# Patient Record
Sex: Male | Born: 1950
Health system: Southern US, Community
[De-identification: ages and names within clinical notes are randomized; demographics above are authoritative.]

## PROBLEM LIST (undated history)

## (undated) DIAGNOSIS — M1990 Unspecified osteoarthritis, unspecified site: Secondary | ICD-10-CM

## (undated) DIAGNOSIS — N200 Calculus of kidney: Secondary | ICD-10-CM

## (undated) DIAGNOSIS — E785 Hyperlipidemia, unspecified: Secondary | ICD-10-CM

## (undated) DIAGNOSIS — E119 Type 2 diabetes mellitus without complications: Secondary | ICD-10-CM

## (undated) DIAGNOSIS — K219 Gastro-esophageal reflux disease without esophagitis: Secondary | ICD-10-CM

## (undated) DIAGNOSIS — I519 Heart disease, unspecified: Secondary | ICD-10-CM

## (undated) DIAGNOSIS — K5090 Crohn's disease, unspecified, without complications: Secondary | ICD-10-CM

## (undated) DIAGNOSIS — D682 Hereditary deficiency of other clotting factors: Secondary | ICD-10-CM

## (undated) DIAGNOSIS — K8020 Calculus of gallbladder without cholecystitis without obstruction: Secondary | ICD-10-CM

## (undated) DIAGNOSIS — K859 Acute pancreatitis without necrosis or infection, unspecified: Secondary | ICD-10-CM

## (undated) DIAGNOSIS — F329 Major depressive disorder, single episode, unspecified: Secondary | ICD-10-CM

## (undated) DIAGNOSIS — F419 Anxiety disorder, unspecified: Secondary | ICD-10-CM

## (undated) DIAGNOSIS — I82409 Acute embolism and thrombosis of unspecified deep veins of unspecified lower extremity: Secondary | ICD-10-CM

## (undated) DIAGNOSIS — F32A Depression, unspecified: Secondary | ICD-10-CM

## (undated) HISTORY — PX: PACEMAKER IMPLANT: EP1218

## (undated) HISTORY — DX: Gastro-esophageal reflux disease without esophagitis: K21.9

## (undated) HISTORY — DX: Type 2 diabetes mellitus without complications: E11.9

## (undated) HISTORY — DX: Calculus of gallbladder without cholecystitis without obstruction: K80.20

## (undated) HISTORY — DX: Acute pancreatitis without necrosis or infection, unspecified: K85.90

## (undated) HISTORY — DX: Depression, unspecified: F32.A

## (undated) HISTORY — DX: Unspecified osteoarthritis, unspecified site: M19.90

## (undated) HISTORY — PX: OTHER SURGICAL HISTORY: SHX169

## (undated) HISTORY — PX: DENTAL SURGERY: SHX609

## (undated) HISTORY — DX: Acute embolism and thrombosis of unspecified deep veins of unspecified lower extremity: I82.409

## (undated) HISTORY — DX: Crohn's disease, unspecified, without complications: K50.90

## (undated) HISTORY — PX: COLONOSCOPY: SHX174

## (undated) HISTORY — DX: Heart disease, unspecified: I51.9

## (undated) HISTORY — PX: UPPER GASTROINTESTINAL ENDOSCOPY: SHX188

## (undated) HISTORY — PX: KIDNEY STONE SURGERY: SHX686

## (undated) HISTORY — DX: Hereditary deficiency of other clotting factors: D68.2

## (undated) HISTORY — PX: PACEMAKER REVISION: EP1221

## (undated) HISTORY — PX: CHOLECYSTECTOMY: SHX55

## (undated) HISTORY — DX: Hyperlipidemia, unspecified: E78.5

## (undated) HISTORY — DX: Anxiety disorder, unspecified: F41.9

## (undated) HISTORY — DX: Calculus of kidney: N20.0

---
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
## 1999-09-25 ENCOUNTER — Ambulatory Visit (HOSPITAL_COMMUNITY): Admission: RE | Admit: 1999-09-25 | Discharge: 1999-09-26 | Payer: Self-pay | Admitting: Internal Medicine

## 1999-09-26 ENCOUNTER — Encounter: Payer: Self-pay | Admitting: Internal Medicine

## 2002-01-23 ENCOUNTER — Encounter: Payer: Self-pay | Admitting: Internal Medicine

## 2002-06-21 ENCOUNTER — Encounter: Payer: Self-pay | Admitting: General Surgery

## 2002-06-27 ENCOUNTER — Encounter (INDEPENDENT_AMBULATORY_CARE_PROVIDER_SITE_OTHER): Payer: Self-pay | Admitting: *Deleted

## 2002-06-27 ENCOUNTER — Ambulatory Visit (HOSPITAL_COMMUNITY): Admission: RE | Admit: 2002-06-27 | Discharge: 2002-06-27 | Payer: Self-pay | Admitting: General Surgery

## 2004-08-09 ENCOUNTER — Ambulatory Visit: Payer: Self-pay | Admitting: Internal Medicine

## 2004-08-12 ENCOUNTER — Ambulatory Visit: Payer: Self-pay

## 2004-09-11 ENCOUNTER — Ambulatory Visit: Payer: Self-pay | Admitting: Internal Medicine

## 2004-09-12 ENCOUNTER — Ambulatory Visit: Payer: Self-pay | Admitting: Internal Medicine

## 2004-10-14 ENCOUNTER — Ambulatory Visit: Payer: Self-pay | Admitting: Internal Medicine

## 2004-11-20 ENCOUNTER — Ambulatory Visit: Payer: Self-pay | Admitting: Internal Medicine

## 2004-12-22 ENCOUNTER — Ambulatory Visit: Payer: Self-pay | Admitting: Internal Medicine

## 2005-02-09 ENCOUNTER — Ambulatory Visit: Payer: Self-pay | Admitting: Internal Medicine

## 2005-03-11 ENCOUNTER — Ambulatory Visit: Payer: Self-pay | Admitting: Internal Medicine

## 2005-05-12 ENCOUNTER — Ambulatory Visit: Payer: Self-pay | Admitting: Internal Medicine

## 2005-05-26 ENCOUNTER — Ambulatory Visit: Payer: Self-pay | Admitting: Internal Medicine

## 2005-06-02 ENCOUNTER — Ambulatory Visit (HOSPITAL_COMMUNITY): Admission: RE | Admit: 2005-06-02 | Discharge: 2005-06-02 | Payer: Self-pay | Admitting: Internal Medicine

## 2005-06-02 ENCOUNTER — Ambulatory Visit: Payer: Self-pay | Admitting: Internal Medicine

## 2005-06-23 ENCOUNTER — Ambulatory Visit: Payer: Self-pay

## 2005-06-29 ENCOUNTER — Ambulatory Visit: Payer: Self-pay | Admitting: Internal Medicine

## 2005-09-29 ENCOUNTER — Ambulatory Visit: Payer: Self-pay | Admitting: Internal Medicine

## 2006-08-31 ENCOUNTER — Ambulatory Visit: Payer: Self-pay | Admitting: Internal Medicine

## 2006-10-16 ENCOUNTER — Ambulatory Visit: Payer: Self-pay | Admitting: Internal Medicine

## 2007-01-31 ENCOUNTER — Ambulatory Visit: Payer: Self-pay | Admitting: Internal Medicine

## 2007-03-28 ENCOUNTER — Ambulatory Visit: Payer: Self-pay | Admitting: Internal Medicine

## 2007-07-27 ENCOUNTER — Ambulatory Visit: Payer: Self-pay | Admitting: Internal Medicine

## 2007-09-06 ENCOUNTER — Ambulatory Visit: Payer: Self-pay | Admitting: Internal Medicine

## 2007-10-04 ENCOUNTER — Ambulatory Visit: Payer: Self-pay | Admitting: Internal Medicine

## 2007-11-15 ENCOUNTER — Ambulatory Visit: Payer: Self-pay | Admitting: Internal Medicine

## 2008-01-16 ENCOUNTER — Ambulatory Visit: Payer: Self-pay | Admitting: Internal Medicine

## 2008-03-15 ENCOUNTER — Ambulatory Visit: Payer: Self-pay

## 2008-03-27 ENCOUNTER — Telehealth: Payer: Self-pay | Admitting: Internal Medicine

## 2008-03-28 ENCOUNTER — Ambulatory Visit: Payer: Self-pay | Admitting: Internal Medicine

## 2008-03-28 DIAGNOSIS — R519 Headache, unspecified: Secondary | ICD-10-CM | POA: Insufficient documentation

## 2008-03-28 DIAGNOSIS — C439 Malignant melanoma of skin, unspecified: Secondary | ICD-10-CM | POA: Insufficient documentation

## 2008-03-28 DIAGNOSIS — R197 Diarrhea, unspecified: Secondary | ICD-10-CM | POA: Insufficient documentation

## 2008-03-28 DIAGNOSIS — R112 Nausea with vomiting, unspecified: Secondary | ICD-10-CM | POA: Insufficient documentation

## 2008-03-28 DIAGNOSIS — R634 Abnormal weight loss: Secondary | ICD-10-CM | POA: Insufficient documentation

## 2008-03-28 DIAGNOSIS — Z950 Presence of cardiac pacemaker: Secondary | ICD-10-CM | POA: Insufficient documentation

## 2008-03-28 DIAGNOSIS — R1084 Generalized abdominal pain: Secondary | ICD-10-CM | POA: Insufficient documentation

## 2008-03-28 DIAGNOSIS — Z85820 Personal history of malignant melanoma of skin: Secondary | ICD-10-CM | POA: Insufficient documentation

## 2008-03-28 DIAGNOSIS — Z872 Personal history of diseases of the skin and subcutaneous tissue: Secondary | ICD-10-CM | POA: Insufficient documentation

## 2008-03-28 DIAGNOSIS — R1032 Left lower quadrant pain: Secondary | ICD-10-CM | POA: Insufficient documentation

## 2008-03-28 DIAGNOSIS — E785 Hyperlipidemia, unspecified: Secondary | ICD-10-CM | POA: Insufficient documentation

## 2008-03-28 DIAGNOSIS — E119 Type 2 diabetes mellitus without complications: Secondary | ICD-10-CM

## 2008-03-28 DIAGNOSIS — R51 Headache: Secondary | ICD-10-CM | POA: Insufficient documentation

## 2008-03-30 ENCOUNTER — Ambulatory Visit: Payer: Self-pay | Admitting: Cardiology

## 2008-04-02 ENCOUNTER — Telehealth: Payer: Self-pay | Admitting: Internal Medicine

## 2008-04-02 DIAGNOSIS — K8020 Calculus of gallbladder without cholecystitis without obstruction: Secondary | ICD-10-CM | POA: Insufficient documentation

## 2008-04-03 ENCOUNTER — Ambulatory Visit: Payer: Self-pay | Admitting: Internal Medicine

## 2008-04-05 ENCOUNTER — Ambulatory Visit (HOSPITAL_COMMUNITY): Admission: RE | Admit: 2008-04-05 | Discharge: 2008-04-05 | Payer: Self-pay | Admitting: Internal Medicine

## 2008-04-09 ENCOUNTER — Ambulatory Visit: Payer: Self-pay | Admitting: Internal Medicine

## 2008-04-09 ENCOUNTER — Encounter: Payer: Self-pay | Admitting: Internal Medicine

## 2008-04-11 ENCOUNTER — Encounter: Payer: Self-pay | Admitting: Internal Medicine

## 2008-04-12 LAB — CONVERTED CEMR LAB
ALT: 24 units/L (ref 0–53)
Basophils Absolute: 0 10*3/uL (ref 0.0–0.1)
Basophils Relative: 0.7 % (ref 0.0–3.0)
CO2: 25 meq/L (ref 19–32)
Calcium: 9 mg/dL (ref 8.4–10.5)
Chloride: 105 meq/L (ref 96–112)
GFR calc Af Amer: 112 mL/min
GFR calc non Af Amer: 92 mL/min
Glucose, Bld: 109 mg/dL — ABNORMAL HIGH (ref 70–99)
Hemoglobin: 14.3 g/dL (ref 13.0–17.0)
Lymphocytes Relative: 27.6 % (ref 12.0–46.0)
Monocytes Relative: 8.8 % (ref 3.0–12.0)
Neutro Abs: 3 10*3/uL (ref 1.4–7.7)
Neutrophils Relative %: 61.1 % (ref 43.0–77.0)
RBC: 4.65 M/uL (ref 4.22–5.81)
RDW: 13.6 % (ref 11.5–14.6)
Sodium: 139 meq/L (ref 135–145)
Total Bilirubin: 1.4 mg/dL — ABNORMAL HIGH (ref 0.3–1.2)
Total Protein: 6 g/dL (ref 6.0–8.3)

## 2008-06-27 ENCOUNTER — Ambulatory Visit: Payer: Self-pay | Admitting: Internal Medicine

## 2008-08-22 ENCOUNTER — Ambulatory Visit: Payer: Self-pay | Admitting: Internal Medicine

## 2008-10-18 ENCOUNTER — Encounter: Payer: Self-pay | Admitting: Internal Medicine

## 2008-12-12 ENCOUNTER — Ambulatory Visit: Payer: Self-pay | Admitting: Internal Medicine

## 2009-04-12 ENCOUNTER — Telehealth: Payer: Self-pay | Admitting: Internal Medicine

## 2009-04-16 ENCOUNTER — Encounter: Payer: Self-pay | Admitting: Physician Assistant

## 2009-04-16 ENCOUNTER — Ambulatory Visit: Payer: Self-pay | Admitting: Gastroenterology

## 2009-04-16 DIAGNOSIS — K5730 Diverticulosis of large intestine without perforation or abscess without bleeding: Secondary | ICD-10-CM | POA: Insufficient documentation

## 2009-04-16 DIAGNOSIS — R1012 Left upper quadrant pain: Secondary | ICD-10-CM | POA: Insufficient documentation

## 2009-04-16 DIAGNOSIS — R1033 Periumbilical pain: Secondary | ICD-10-CM | POA: Insufficient documentation

## 2009-04-17 ENCOUNTER — Encounter: Payer: Self-pay | Admitting: Physician Assistant

## 2009-04-17 ENCOUNTER — Ambulatory Visit: Payer: Self-pay | Admitting: Internal Medicine

## 2009-04-17 LAB — CONVERTED CEMR LAB
Alkaline Phosphatase: 97 units/L (ref 39–117)
BUN: 11 mg/dL (ref 6–23)
Basophils Relative: 0.2 % (ref 0.0–3.0)
CO2: 33 meq/L — ABNORMAL HIGH (ref 19–32)
CRP, High Sensitivity: <1 (ref 0.00–5.00)
Creatinine, Ser: 0.8 mg/dL (ref 0.4–1.5)
Eosinophils Relative: 2.2 % (ref 0.0–5.0)
GFR calc non Af Amer: 105.17 mL/min (ref >60)
Glucose, Bld: 131 mg/dL — ABNORMAL HIGH (ref 70–99)
HCT: 43.1 % (ref 39.0–52.0)
Hemoglobin: 14.9 g/dL (ref 13.0–17.0)
Ketones, ur: NEGATIVE mg/dL
Leukocytes, UA: NEGATIVE
Lipase: 23 units/L (ref 11.0–59.0)
Lymphs Abs: 1.6 10*3/uL (ref 0.7–4.0)
MCV: 88.8 fL (ref 78.0–100.0)
Monocytes Absolute: 0.6 10*3/uL (ref 0.1–1.0)
Monocytes Relative: 9.3 % (ref 3.0–12.0)
Neutro Abs: 3.8 10*3/uL (ref 1.4–7.7)
Nitrite: NEGATIVE
Platelets: 162 10*3/uL (ref 150.0–400.0)
RBC: 4.85 M/uL (ref 4.22–5.81)
Sodium: 139 meq/L (ref 135–145)
Specific Gravity, Urine: >=1.03 (ref 1.000–1.030)
Total Bilirubin: 1.1 mg/dL (ref 0.3–1.2)
Total Protein, Urine: NEGATIVE mg/dL
WBC: 6.1 10*3/uL (ref 4.5–10.5)
pH: 5 (ref 5.0–8.0)

## 2009-04-20 ENCOUNTER — Ambulatory Visit: Payer: Self-pay | Admitting: Internal Medicine

## 2009-04-25 ENCOUNTER — Encounter: Payer: Self-pay | Admitting: Physician Assistant

## 2009-05-03 ENCOUNTER — Encounter: Payer: Self-pay | Admitting: Gastroenterology

## 2009-05-03 ENCOUNTER — Ambulatory Visit: Payer: Self-pay | Admitting: Internal Medicine

## 2009-05-03 DIAGNOSIS — R935 Abnormal findings on diagnostic imaging of other abdominal regions, including retroperitoneum: Secondary | ICD-10-CM | POA: Insufficient documentation

## 2009-05-06 ENCOUNTER — Encounter: Payer: Self-pay | Admitting: Internal Medicine

## 2009-05-06 LAB — CONVERTED CEMR LAB
AST: 14 units/L (ref 0–37)
Alkaline Phosphatase: 98 units/L (ref 39–117)
Amylase: 89 units/L (ref 27–131)
BUN: 10 mg/dL (ref 6–23)
Glucose, Bld: 241 mg/dL — ABNORMAL HIGH (ref 70–99)
Lipase: 67 units/L — ABNORMAL HIGH (ref 11.0–59.0)
Sodium: 140 meq/L (ref 135–145)
Total Bilirubin: 1 mg/dL (ref 0.3–1.2)

## 2009-05-23 ENCOUNTER — Ambulatory Visit (HOSPITAL_COMMUNITY): Admission: RE | Admit: 2009-05-23 | Discharge: 2009-05-23 | Payer: Self-pay | Admitting: Gastroenterology

## 2009-05-23 ENCOUNTER — Ambulatory Visit: Payer: Self-pay | Admitting: Gastroenterology

## 2009-05-29 ENCOUNTER — Encounter: Payer: Self-pay | Admitting: Internal Medicine

## 2009-07-04 ENCOUNTER — Telehealth (INDEPENDENT_AMBULATORY_CARE_PROVIDER_SITE_OTHER): Payer: Self-pay | Admitting: *Deleted

## 2009-07-10 ENCOUNTER — Encounter (INDEPENDENT_AMBULATORY_CARE_PROVIDER_SITE_OTHER): Payer: Self-pay | Admitting: General Surgery

## 2009-07-10 ENCOUNTER — Ambulatory Visit (HOSPITAL_COMMUNITY): Admission: RE | Admit: 2009-07-10 | Discharge: 2009-07-11 | Payer: Self-pay | Admitting: General Surgery

## 2009-07-25 ENCOUNTER — Encounter: Payer: Self-pay | Admitting: Internal Medicine

## 2009-08-14 ENCOUNTER — Encounter: Payer: Self-pay | Admitting: Internal Medicine

## 2009-08-28 ENCOUNTER — Encounter: Payer: Self-pay | Admitting: Internal Medicine

## 2009-08-29 ENCOUNTER — Ambulatory Visit: Payer: Self-pay | Admitting: Internal Medicine

## 2009-10-24 ENCOUNTER — Encounter: Admission: RE | Admit: 2009-10-24 | Discharge: 2009-10-24 | Payer: Self-pay | Admitting: Unknown Physician Specialty

## 2009-11-28 ENCOUNTER — Ambulatory Visit: Payer: Self-pay | Admitting: Internal Medicine

## 2010-02-03 ENCOUNTER — Telehealth: Payer: Self-pay | Admitting: Internal Medicine

## 2010-02-03 ENCOUNTER — Ambulatory Visit: Payer: Self-pay | Admitting: Internal Medicine

## 2010-02-06 ENCOUNTER — Encounter (INDEPENDENT_AMBULATORY_CARE_PROVIDER_SITE_OTHER): Payer: Self-pay | Admitting: *Deleted

## 2010-02-10 ENCOUNTER — Ambulatory Visit: Payer: Self-pay | Admitting: Internal Medicine

## 2010-02-10 DIAGNOSIS — R42 Dizziness and giddiness: Secondary | ICD-10-CM | POA: Insufficient documentation

## 2010-02-24 ENCOUNTER — Ambulatory Visit: Payer: Self-pay | Admitting: Internal Medicine

## 2010-02-24 DIAGNOSIS — K861 Other chronic pancreatitis: Secondary | ICD-10-CM | POA: Insufficient documentation

## 2010-02-25 LAB — CONVERTED CEMR LAB
CEA: 0.9 ng/mL (ref 0.0–5.0)
Lipase: 82 units/L — ABNORMAL HIGH (ref 11.0–59.0)

## 2010-03-12 ENCOUNTER — Ambulatory Visit: Payer: Self-pay | Admitting: Endocrinology

## 2010-04-02 ENCOUNTER — Ambulatory Visit: Payer: Self-pay | Admitting: Endocrinology

## 2010-04-23 ENCOUNTER — Ambulatory Visit: Payer: Self-pay | Admitting: Endocrinology

## 2010-05-29 ENCOUNTER — Ambulatory Visit: Payer: Self-pay | Admitting: Internal Medicine

## 2010-06-11 ENCOUNTER — Ambulatory Visit: Payer: Self-pay | Admitting: Internal Medicine

## 2010-07-24 ENCOUNTER — Encounter (INDEPENDENT_AMBULATORY_CARE_PROVIDER_SITE_OTHER): Payer: Self-pay | Admitting: *Deleted

## 2010-08-12 NOTE — Procedures (Signed)
Summary: [facility]/ Lipase, CMET, Amylase  [facility]/ Lipase, CMET, Amylase   Imported By: Christie Nottingham CMA (AAMA) 02/21/2010 16:24:06  _____________________________________________________________________  External Attachment:    Type:   Image     Comment:   External Document

## 2010-08-12 NOTE — Assessment & Plan Note (Signed)
Summary: 3 WK FU---STC   Vital Signs:  Patient profile:   60 year old male Height:      68 inches (172.72 cm) Weight:      191.44 pounds (87.02 kg) BMI:     29.21 O2 Sat:      95 % on Room air Temp:     97.1 degrees F (36.17 degrees C) oral Pulse rate:   70 / minute BP sitting:   108 / 72  (left arm) Cuff size:   regular  Vitals Entered By: Brenton Grills MA (April 23, 2010 10:44 AM)  O2 Flow:  Room air CC: Follow-up visit/aj   Referring Provider:  Feliciana Rossetti, MD Primary Provider:  Feliciana Rossetti, MD  CC:  Follow-up visit/aj.  History of Present Illness: he brings a record of his cbg's which i have reviewed today. cbg's are highest at hs, and in am (200's), and lower in the afternoon (100).  he continues to have intermittent abdominal pain.  Current Medications (verified): 1)  Simvastatin 40 Mg Tabs (Simvastatin) .... Take 1 Tab At Bedtime 2)  Neurontin 600 Mg Tabs (Gabapentin) .... One By Mouth Two Times A Day 3)  Actos 45 Mg Tabs (Pioglitazone Hcl) .... Take 1 Tab Once Daily 4)  Ambien Cr 12.5 Mg Cr-Tabs (Zolpidem Tartrate) .... Take As Needed 5)  Tamsulosin Hcl 0.4 Mg Caps (Tamsulosin Hcl) .Marland Kitchen.. 1 Capsule By Mouth Once Daily 6)  Paxil 20 Mg Tabs (Paroxetine Hcl) .Marland Kitchen.. 1 Tablet By Mouth Once Daily 7)  Skelaxin 800 Mg Tabs (Metaxalone) .... 1/2 Tablet By Mouth Two Times A Day 8)  Ranitidine Hcl 150 Mg Caps (Ranitidine Hcl) .... One By Mouth Daily 9)  Lantus 100 Unit/ml Soln (Insulin Glargine) .... Take 20 Units Every Night 10)  Creon 12000 Unit Cpep (Pancrelipase (Lip-Prot-Amyl)) .Marland Kitchen.. 1 By Mouth Before Each Meal 11)  Humalog Kwikpen 100 Unit/ml Soln (Insulin Lispro (Human)) .... 8 Units Three Times A Day (Just Before Each Meal), and Pen Needles Three Times A Day  Allergies (verified): No Known Drug Allergies  Past History:  Past Medical History: Last updated: 02/24/2010 Current Problems:  DIABETES MELLITUS, TYPE II, ON INSULIN (ICD-250.00) DIZZINESS  (ICD-780.4) NONSPEC ABN FINDNG RAD & OTH EXAM ABDOMINAL AREA (ICD-793.6) DIVERTICULOSIS-COLON (ICD-562.10) ABDOMINAL PAIN-PERIUMBILICAL (ICD-789.05) LUQ PAIN (ICD-789.02) CHOLELITHIASIS (ICD-574.20) Hx of MELANOMA (ICD-172.9) Hx of PERSONAL HISTORY OF MALIGNANT MELANOMA OF SKIN (ICD-V10.82) NAUSEA AND VOMITING (ICD-787.01) WEIGHT LOSS-ABNORMAL (ICD-783.21) DIARRHEA (ICD-787.91) ABDOMINAL PAIN-LLQ (ICD-789.04) ABDOMINAL PAIN-GENERALIZED (ICD-789.07) HEADACHE, CHRONIC (ICD-784.0) HYPERLIPIDEMIA (ICD-272.4) DM (ICD-250.00) ANAL FISSURE, HX OF (ICD-V13.3) CARDIAC PACEMAKER IN SITU (ICD-V45.01)  Review of Systems  The patient denies hypoglycemia.    Physical Exam  General:  normal appearance.   Psych:  Alert and cooperative; normal mood and affect; normal attention span and concentration.     Impression & Recommendations:  Problem # 1:  DIABETES MELLITUS, TYPE II, ON INSULIN (ICD-250.00) he needs some adjustment in his therapy  Medications Added to Medication List This Visit: 1)  Humalog Kwikpen 100 Unit/ml Soln (Insulin lispro (human)) .... Three times a day (just before each meal), and pen needles three times a day 02-17-17 units. 2)  Onetouch Ultra Blue Strp (Glucose blood) .... Three times a day, and lancets 250.01  Other Orders: Est. Patient Level III (16109)  Patient Instructions: 1)  check your blood sugar 2 times a day.  vary the time of day when you check, between before the 3 meals, and at bedtime.  also check if you have symptoms  of your blood sugar being too high or too low.  please keep a record of the readings and bring it to your next appointment here.  please call us sooner if you are having low blood sugar episodes. 2)  we will need to take this complex situation in stages. 3)  continue lantus 20 units at bedtime. 4)  increase humalog to units three times a day (just before each meal). 02-17-17. 5)  Please schedule a follow-up appointment in 2-3 weeks. 6)  stop  actos when yuou run out of your current supply.   Prescriptions: ONETOUCH ULTRA BLUE  STRP (GLUCOSE BLOOD) three times a day, and lancets 250.01  #270 x 3   Entered and Authorized by:   Minus Breeding MD   Signed by:   Minus Breeding MD on 04/23/2010   Method used:   Print then Give to Patient   RxID:   [id number]

## 2010-08-12 NOTE — Cardiovascular Report (Signed)
Summary: TTM   TTM   Imported By: Roderic Ovens 09/09/2009 14:03:53  _____________________________________________________________________  External Attachment:    Type:   Image     Comment:   External Document

## 2010-08-12 NOTE — Assessment & Plan Note (Signed)
Summary: NEW ENDO CON/ [employer]/ DM /NWS  #   Vital Signs:  Patient profile:   60 year old male Height:      68 inches (172.72 cm) Weight:      193.50 pounds (87.95 kg) BMI:     29.53 O2 Sat:      96 % on Room air Temp:     96.7 degrees F (35.94 degrees C) oral Pulse rate:   70 / minute BP sitting:   110 / 68  (left arm) Cuff size:   regular  Vitals Entered By: Brenton Grills MA (March 12, 2010 9:55 AM)  O2 Flow:  Room air CC: New Endo/DM/chronic pancreatitis/pt requesting samples/aj Is Patient Diabetic? Yes   Referring Provider:  Feliciana Rossetti, MD Primary Provider:  Feliciana Rossetti, MD  CC:  New Endo/DM/chronic pancreatitis/pt requesting samples/aj.  History of Present Illness: pt states 10 years h/o dm.  it is complicated by mild cad, and neuropathy.  he has been on insulin x 1 month.  he takes lantus 30 units qhs.  no cbg record, but states cbg's vary from 160-400.  it is in general higher later in the day.     pt says his diet and exercise are "good."   symptomatically, pt states few years of moderate pain at the hands and feet, and associated numbness. use of metformin was limited by diarrhea.  Current Medications (verified): 1)  Simvastatin 40 Mg Tabs (Simvastatin) .... Take 1 Tab At Bedtime 2)  Neurontin 600 Mg Tabs (Gabapentin) .... One By Mouth Two Times A Day 3)  Actos 45 Mg Tabs (Pioglitazone Hcl) .... Take 1 Tab Once Daily 4)  Ambien Cr 12.5 Mg Cr-Tabs (Zolpidem Tartrate) .... Take As Needed 5)  Tamsulosin Hcl 0.4 Mg Caps (Tamsulosin Hcl) .Marland Kitchen.. 1 Capsule By Mouth Once Daily 6)  Paxil 20 Mg Tabs (Paroxetine Hcl) .Marland Kitchen.. 1 Tablet By Mouth Once Daily 7)  Skelaxin 800 Mg Tabs (Metaxalone) .... 1/2 Tablet By Mouth Two Times A Day 8)  Ranitidine Hcl 150 Mg Caps (Ranitidine Hcl) .... One By Mouth Daily 9)  Lantus 100 Unit/ml Soln (Insulin Glargine) .... Take 30 Units By Mouth Every Night 10)  Creon 12000 Unit Cpep (Pancrelipase (Lip-Prot-Amyl)) .Marland Kitchen.. 1 By Mouth Before Each  Meal  Allergies (verified): No Known Drug Allergies  Past History:  Past Medical History: Last updated: 02/24/2010 Current Problems:  DIABETES MELLITUS, TYPE II, ON INSULIN (ICD-250.00) DIZZINESS (ICD-780.4) NONSPEC ABN FINDNG RAD & OTH EXAM ABDOMINAL AREA (ICD-793.6) DIVERTICULOSIS-COLON (ICD-562.10) ABDOMINAL PAIN-PERIUMBILICAL (ICD-789.05) LUQ PAIN (ICD-789.02) CHOLELITHIASIS (ICD-574.20) Hx of MELANOMA (ICD-172.9) Hx of PERSONAL HISTORY OF MALIGNANT MELANOMA OF SKIN (ICD-V10.82) NAUSEA AND VOMITING (ICD-787.01) WEIGHT LOSS-ABNORMAL (ICD-783.21) DIARRHEA (ICD-787.91) ABDOMINAL PAIN-LLQ (ICD-789.04) ABDOMINAL PAIN-GENERALIZED (ICD-789.07) HEADACHE, CHRONIC (ICD-784.0) HYPERLIPIDEMIA (ICD-272.4) DM (ICD-250.00) ANAL FISSURE, HX OF (ICD-V13.3) CARDIAC PACEMAKER IN SITU (ICD-V45.01)  Social History: Reviewed history from 04/16/2009 and no changes required. Occupation: Retired married Patient has never smoked.  Daily Caffeine Use Coffee and sodas Patient does not get regular exercise.  Alcohol Use - no no children   Review of Systems       The patient complains of weight loss, vision loss, headaches, and depression.         denies chest pain, n/v, urinary excessive diaphoresis, memory loss, hypoglycemia, and rhinorrhea.  he has chronic abdominal pain, polyuria, doe, easy bruising, and leg cramps.  Physical Exam  General:  obese.  no distress  Head:  head: no deformity eyes: no periorbital swelling, no proptosis external  nose and ears are normal mouth: no lesion seen Neck:  Supple without thyroid enlargement or tenderness. .  Lungs:  Clear to auscultation bilaterally. Normal respiratory effort.  Heart:  Regular rate and rhythm without murmurs or gallops noted. Normal S1,S2.   Abdomen:  abdomen is soft, nontender.  no hepatosplenomegaly.   not distended.  no hernia  Msk:  muscle bulk and strength are grossly normal.  no obvious joint swelling.  gait is normal  and steady  Pulses:  dorsalis pedis intact bilat.  no carotid bruit  Extremities:  no deformity.  no ulcer on the feet.  feet are of normal color and temp.  no edema several toenails are surgically absent Neurologic:  cn 2-12 grossly intact.   readily moves all 4's.   sensation is intact to touch on the feet  Skin:  normal texture and temp.  no rash.  not diaphoretic  Cervical Nodes:  No significant adenopathy.  Psych:  Alert and cooperative; normal mood and affect; normal attention span and concentration.     Impression & Recommendations:  Problem # 1:  DIABETES MELLITUS, TYPE II, ON INSULIN (ICD-250.00) probably type 2 dm needs increased rx  Problem # 2:  PANCREATITIS, CHRONIC (ICD-577.1) not the cause of #1, as the onset was later than #1  Problem # 3:  DIARRHEA (ICD-787.91) this limits rx with metformin  Medications Added to Medication List This Visit: 1)  Lantus 100 Unit/ml Soln (Insulin glargine) .... Take 35 units every night 2)  Lantus 100 Unit/ml Soln (Insulin glargine) .... Take 30 units by mouth every night 3)  Creon 12000 Unit Cpep (Pancrelipase (lip-prot-amyl)) .Marland Kitchen.. 1 by mouth before each meal  Other Orders: Consultation Level IV (16109)  Patient Instructions: 1)  good diet and exercise habits significanly improve the control of your diabetes.  please let me know if you wish to be referred to a dietician.  high blood sugar is very risky to your health.  you should see an eye doctor every year. 2)  controlling your blood pressure and cholesterol drastically reduces the damage diabetes does to your body.  this also applies to quitting smoking.  please discuss these with your doctor.  you should take an aspirin every day, unless you have been advised by a doctor not to. 3)  check your blood sugar 2 times a day.  vary the time of day when you check, between before the 3 meals, and at bedtime.  also check if you have symptoms of your blood sugar being too high or too  low.  please keep a record of the readings and bring it to your next appointment here.  please call us sooner if you are having low blood sugar episodes. 4)  we will need to take this complex situation in stages 5)  for now, increase lantus to 35 units once daily.  then increase to 40 units once daily, if necessary, with a goal blood sugar of the low-100's, at some time of day.   6)  Please schedule a follow-up appointment in 2-3 weeks.

## 2010-08-12 NOTE — Letter (Signed)
Summary: [facility]   Imported By: Lester Elmhurst 08/30/2009 10:16:47  _____________________________________________________________________  External Attachment:    Type:   Image     Comment:   External Document

## 2010-08-12 NOTE — Assessment & Plan Note (Signed)
Summary: 2 - 3 WK FU---STC   Vital Signs:  Patient profile:   60 year old male Height:      68 inches (172.72 cm) Weight:      197.38 pounds (89.72 kg) BMI:     30.12 O2 Sat:      96 % on Room air Temp:     97.9 degrees F (36.61 degrees C) oral Pulse rate:   72 / minute BP sitting:   132 / 82  (left arm) Cuff size:   regular  Vitals Entered By: Brenton Grills MA (April 02, 2010 10:10 AM)  O2 Flow:  Room air CC: 2-3 week F/U/aj Is Patient Diabetic? Yes   Referring Provider:  Feliciana Rossetti, MD Primary Provider:  Feliciana Rossetti, MD  CC:  2-3 week F/U/aj.  History of Present Illness: on lantus 35 units at bedtime, cbg's were 100 in am, then it would increase to 300 later in the day.  pt states he feels well in general.    Current Medications (verified): 1)  Simvastatin 40 Mg Tabs (Simvastatin) .... Take 1 Tab At Bedtime 2)  Neurontin 600 Mg Tabs (Gabapentin) .... One By Mouth Two Times A Day 3)  Actos 45 Mg Tabs (Pioglitazone Hcl) .... Take 1 Tab Once Daily 4)  Ambien Cr 12.5 Mg Cr-Tabs (Zolpidem Tartrate) .... Take As Needed 5)  Tamsulosin Hcl 0.4 Mg Caps (Tamsulosin Hcl) .Marland Kitchen.. 1 Capsule By Mouth Once Daily 6)  Paxil 20 Mg Tabs (Paroxetine Hcl) .Marland Kitchen.. 1 Tablet By Mouth Once Daily 7)  Skelaxin 800 Mg Tabs (Metaxalone) .... 1/2 Tablet By Mouth Two Times A Day 8)  Ranitidine Hcl 150 Mg Caps (Ranitidine Hcl) .... One By Mouth Daily 9)  Lantus 100 Unit/ml Soln (Insulin Glargine) .... Take 35 Units Every Night 10)  Creon 12000 Unit Cpep (Pancrelipase (Lip-Prot-Amyl)) .Marland Kitchen.. 1 By Mouth Before Each Meal  Allergies (verified): No Known Drug Allergies  Past History:  Past Medical History: Last updated: 02/24/2010 Current Problems:  DIABETES MELLITUS, TYPE II, ON INSULIN (ICD-250.00) DIZZINESS (ICD-780.4) NONSPEC ABN FINDNG RAD & OTH EXAM ABDOMINAL AREA (ICD-793.6) DIVERTICULOSIS-COLON (ICD-562.10) ABDOMINAL PAIN-PERIUMBILICAL (ICD-789.05) LUQ PAIN (ICD-789.02) CHOLELITHIASIS  (ICD-574.20) Hx of MELANOMA (ICD-172.9) Hx of PERSONAL HISTORY OF MALIGNANT MELANOMA OF SKIN (ICD-V10.82) NAUSEA AND VOMITING (ICD-787.01) WEIGHT LOSS-ABNORMAL (ICD-783.21) DIARRHEA (ICD-787.91) ABDOMINAL PAIN-LLQ (ICD-789.04) ABDOMINAL PAIN-GENERALIZED (ICD-789.07) HEADACHE, CHRONIC (ICD-784.0) HYPERLIPIDEMIA (ICD-272.4) DM (ICD-250.00) ANAL FISSURE, HX OF (ICD-V13.3) CARDIAC PACEMAKER IN SITU (ICD-V45.01)  Review of Systems  The patient denies hypoglycemia.    Physical Exam  General:  normal appearance.   Skin:  insulin injection sites at anterior abdomen have a few subcutaneous soft nodules.   Impression & Recommendations:  Problem # 1:  DIABETES MELLITUS, TYPE II, ON INSULIN (ICD-250.00) needs increased rx the pattern of his cbg's indicates the need for mealtime insulin.    Medications Added to Medication List This Visit: 1)  Lantus 100 Unit/ml Soln (Insulin glargine) .... Take 20 units every night 2)  Humalog Kwikpen 100 Unit/ml Soln (Insulin lispro (human)) .... 8 units three times a day (just before each meal), and pen needles three times a day  Other Orders: Est. Patient Level III (10272)  Patient Instructions: 1)  check your blood sugar 2 times a day.  vary the time of day when you check, between before the 3 meals, and at bedtime.  also check if you have symptoms of your blood sugar being too high or too low.  please keep a record of the readings and  bring it to your next appointment here.  please call us sooner if you are having low blood sugar episodes. 2)  we will need to take this complex situation in stages 3)  reduce lantus to 20 units at bedtime 4)  start humalog 8 units three times a day (just before each meal). 5)  Please schedule a follow-up appointment in 3 weeks. Prescriptions: HUMALOG KWIKPEN 100 UNIT/ML SOLN (INSULIN LISPRO (HUMAN)) 8 units three times a day (just before each meal), and pen needles three times a day  #1 box x 11   Entered and  Authorized by:   Minus Breeding MD   Signed by:   Minus Breeding MD on 04/02/2010   Method used:   Print then Give to Patient   RxID:   [id number]

## 2010-08-12 NOTE — Procedures (Signed)
Summary: pacer check/[employer]   Current Medications (verified): 1)  Simvastatin 40 Mg Tabs (Simvastatin) .... Take 1 Tab At Bedtime 2)  Neurontin 600 Mg Tabs (Gabapentin) .... One By Mouth Two Times A Day 3)  Glyburide-Metformin 5-500 Mg Tabs (Glyburide-Metformin) .... Take 2 Tab Two Times A Day 4)  Actos 45 Mg Tabs (Pioglitazone Hcl) .... Take 1 Tab Once Daily 5)  Ambien Cr 12.5 Mg Cr-Tabs (Zolpidem Tartrate) .... Take As Needed 6)  Tamsulosin Hcl 0.4 Mg Caps (Tamsulosin Hcl) .Marland Kitchen.. 1 Capsule By Mouth Once Daily 7)  Paxil 20 Mg Tabs (Paroxetine Hcl) .Marland Kitchen.. 1 Tablet By Mouth Once Daily 8)  Skelaxin 800 Mg Tabs (Metaxalone) .... 1/2 Tablet By Mouth Two Times A Day 9)  Creon ( Amylase, Protease,  Amylase) .... Take 1 Tablet By Mouth Before Each Meal. 10)  Vicodin 5-500 Mg Tabs (Hydrocodone-Acetaminophen) .... Take 1 Tablet By Mouth Every 4-6 Hours As Needed For Severe Pain 11)  Paxil 20 Mg Tabs (Paroxetine Hcl) .... One By Mouth Daily 12)  Ranitidine Hcl 150 Mg Caps (Ranitidine Hcl) .... One By Mouth Daily  Allergies (verified): No Known Drug Allergies  PPM Specifications Following MD:  Sharrell Ku     PPM VendorHerbert Pun     PPM Model Number:  [id number]     PPM Serial Number:  [id number] PPM DOI:  06/02/2005      Lead 1    Location: RA     DOI: 09/25/1999     Model #: [id number]     Serial #: [id number]     Status: active  Magnet Response Rate:  BOL 100 ERI  85  Indications:  BRADYCARDIA   PPM Follow Up Remote Check?  No Battery Voltage:  GOOD V     Battery Est. Longevity:  4 years     Pacer Dependent:  No       PPM Device Measurements [employer]  Amplitude: 3.3 mV, Impedance: 560 ohms, Threshold: 0.6 V at 0.3 msec Right Ventricle  Amplitude: 12 mV, Impedance: 380 ohms, Threshold: 1.4 V at 1.0 msec  Episodes MS Episodes:  0     Percent Mode Switch:  0     Coumadin:  No Atrial Pacing:  81%     Ventricular Pacing:  49%  Parameters Mode:  DDDR     Lower Rate Limit:  70     Upper Rate Limit:   130 Paced AV Delay:  300     Sensed AV Delay:  300 Tech Comments:  Mr. Pieroni was seen today as an add on for increasing dizziness worse over the last 2  weeks when lying and sitting.  Mr. Camacho has not been checked in the office since 03/15/2008, he is followed by TTM's.  No programming changes.  Device function normal. He will continue his TTM's with [employer] and also has an appointment later this week with his PCP.  ROV 6 months with Dr. Ladona Ridgel. Altha Harm, LPN  February 03, 2010 3:24 PM

## 2010-08-12 NOTE — Assessment & Plan Note (Signed)
Summary: rov c/o dizziness  appt at 2:15 pm./cy   Visit Type:  Follow-up Referring Provider:  Feliciana Rossetti, MD Primary Provider:  Feliciana Rossetti, MD   History of Present Illness: Mark Rojas returns today for followup.  He is a pleasant 60 yo man with a h/o symptomatic bradycardia, s/p ppm who has had chronic diarrhea.  He notes that about 3 weeks ago, he began to experience periods of dizziness and light headedness.  The patient has not had frank syncope but notes that the episodes tend to occur when he is straining - like when he is bending over to get under the sink.  He denies c/p or sob or peripheral edema.  Current Medications (verified): 1)  Simvastatin 40 Mg Tabs (Simvastatin) .... Take 1 Tab At Bedtime 2)  Neurontin 600 Mg Tabs (Gabapentin) .... One By Mouth Two Times A Day 3)  Actos 45 Mg Tabs (Pioglitazone Hcl) .... Take 1 Tab Once Daily 4)  Ambien Cr 12.5 Mg Cr-Tabs (Zolpidem Tartrate) .... Take As Needed 5)  Tamsulosin Hcl 0.4 Mg Caps (Tamsulosin Hcl) .Marland Kitchen.. 1 Capsule By Mouth Once Daily 6)  Paxil 20 Mg Tabs (Paroxetine Hcl) .Marland Kitchen.. 1 Tablet By Mouth Once Daily 7)  Skelaxin 800 Mg Tabs (Metaxalone) .... 1/2 Tablet By Mouth Two Times A Day 8)  Creon ( Amylase, Protease,  Amylase) .... Take 1 Tablet By Mouth Before Each Meal. 9)  Ranitidine Hcl 150 Mg Caps (Ranitidine Hcl) .... One By Mouth Daily 10)  Lantus 100 Unit/ml Soln (Insulin Glargine) .... Uad  Allergies (verified): No Known Drug Allergies  Past History:  Past Medical History: Last updated: 05/01/2009 Current Problems:  DIVERTICULOSIS-COLON (ICD-562.10) ABDOMINAL PAIN-PERIUMBILICAL (ICD-789.05) LUQ PAIN (ICD-789.02) CHOLELITHIASIS (ICD-574.20) Hx of MELANOMA (ICD-172.9) Hx of PERSONAL HISTORY OF MALIGNANT MELANOMA OF SKIN (ICD-V10.82) NAUSEA AND VOMITING (ICD-787.01) WEIGHT LOSS-ABNORMAL (ICD-783.21) DIARRHEA (ICD-787.91) ABDOMINAL PAIN-LLQ (ICD-789.04) ABDOMINAL PAIN-GENERALIZED (ICD-789.07) HEADACHE, CHRONIC  (ICD-784.0) HYPERLIPIDEMIA (ICD-272.4) DM (ICD-250.00) ANAL FISSURE, HX OF (ICD-V13.3) CARDIAC PACEMAKER IN SITU (ICD-V45.01)    Past Surgical History: Last updated: 04/16/2009 Anal Fissure surgery Pacemaker placed in 2003 x 2  Family History: Last updated: 05/01/2009 Stomach wall CA-Mother Prostate-Father Diabetes-Father No FH of Colon Cancer: Family History of Heart Disease: MGM, PGF, PGM Family History of Prostate Cancer: Paternal Grandfather  Social History: Last updated: 04/16/2009 Occupation: Retired Patient has never smoked.  Daily Caffeine Use Coffee and sodas Patient does not get regular exercise.  Alcohol Use - no no children   Review of Systems       All systems reviewed and negative except as noted in the HPI.  Vital Signs:  Patient profile:   60 year old male Height:      68 inches Weight:      193 pounds BMI:     29.45 Pulse rate:   69 / minute BP sitting:   120 / 80  (left arm)  Vitals Entered By: Mark Rojas CMA (February 10, 2010 2:08 PM)  Physical Exam  General:  Well developed, well nourished, no acute distress. Head:  Normocephalic and atraumatic. Eyes:  PERRLA, no icterus. Mouth:  No deformity or lesions, dentition normal. Neck:  Supple; no masses or thyromegaly. Chest Wall:  Well healed PPM incision. Lungs:  Clear throughout to auscultation. Heart:  Regular rate and rhythm; no murmurs, rubs,  or bruits. Abdomen:  tenderness in epigastrium to the left of the midline with no distention. Normoactive bowel sounds. No bruits. Liver edge at costal margin. Lower abdomen unremarkable. Msk:  Symmetrical with no gross deformities. Normal posture. Pulses:  pulses normal in all 4 extremities Extremities:  No clubbing, cyanosis, edema or deformities noted. Neurologic:  Alert and  oriented x4;  grossly normal neurologically.   PPM Specifications Following MD:  Mark Rojas     PPM VendorHerbert Rojas     PPM Model Number:  [id number]     PPM Serial Number:   [id number] PPM DOI:  06/02/2005      Lead 1    Location: [facility]     DOI: 09/25/1999     Model #: [id number]     Serial #: [id number]     Status: active  Magnet Response Rate:  BOL 100 ERI  85  Indications:  BRADYCARDIA   PPM Follow Up Pacer Dependent:  No      Episodes Coumadin:  No  Parameters Mode:  DDDR     Lower Rate Limit:  70     Upper Rate Limit:  130 Paced AV Delay:  300     Sensed AV Delay:  300 MD Comments:  Normal device function without SVT.  Impression & Recommendations:  Problem # 1:  CARDIAC PACEMAKER IN SITU (ICD-V45.01) His device is working normally.  Will recheck in several months.  Problem # 2:  DIZZINESS (ICD-780.4) His spells appear to be due to straining and I suspect related to impaired venous return.  I have asked him to increase his blood pressure by increasing his sodium intake.  Will follow and increase fluid intake.  Problem # 3:  HYPERLIPIDEMIA (ICD-272.4) He will continue his current meds. His updated medication list for this problem includes:    Simvastatin 40 Mg Tabs (Simvastatin) .Marland Kitchen... Take 1 tab at bedtime  Patient Instructions: 1)  Your physician recommends that you schedule a follow-up appointment in: 3-4 months with Dr Mark Rojas  Appended Document: [facility]     Allergies: No Known Drug Allergies  Vital Signs:  Patient profile:   60 year old male Pulse (ortho):   70 / minute BP standing:   107 / 68  Serial Vital Signs/Assessments:  Time      Position  BP       Pulse  Resp  Temp     By           Lying RA  122/76   70                    Mark Rojas CMA           Sitting   118/75   70                    Mark Rojas CMA           Standing  107/68   70                    Mark Rojas CMA           Standing  122/77   69                    Mark Rojas CMA           Standing  125/79   69                    Mark Rojas CMA  Comments: During test patient was lightheaded, felt dizzy and had bluired vision. By: Mark Rojas CMA     PPM  Specifications Following MD:  Mark Rojas     PPM VendorHerbert Rojas     PPM Model Number:  [id number]     PPM Serial Number:  [id number] PPM DOI:  06/02/2005      Lead 1    Location: [facility]     DOI: 09/25/1999     Model #: [id number]     Serial #: [id number]     Status: active  Magnet Response Rate:  BOL 100 ERI  85  Indications:  BRADYCARDIA   PPM Follow Up Pacer Dependent:  No      Episodes Coumadin:  No  Parameters Mode:  DDDR     Lower Rate Limit:  70     Upper Rate Limit:  130 Paced AV Delay:  300     Sensed AV Delay:  300

## 2010-08-12 NOTE — Cardiovascular Report (Signed)
Summary: TTM   TTM   Imported By: Roderic Ovens 06/20/2010 11:21:36  _____________________________________________________________________  External Attachment:    Type:   Image     Comment:   External Document

## 2010-08-12 NOTE — Cardiovascular Report (Signed)
Summary: Transtelephonic Pacemaker Monitoring Report  Transtelephonic Pacemaker Monitoring Report   Imported By: Debby Freiberg 01/09/2010 11:19:25  _____________________________________________________________________  External Attachment:    Type:   Image     Comment:   External Document

## 2010-08-12 NOTE — Assessment & Plan Note (Signed)
Summary: DIARRHEA...AS.   History of Present Illness Visit Type: Follow-up Visit Primary GI MD: Lina Sar MD Primary Provider: Feliciana Rossetti, MD Requesting Provider: na Chief Complaint: Patient complains of left sided abdominal pain and chronic diarrhea. He has changed his diabetic medication from metformin to lantun and that has helped decrease the diarrhea but he still has it.  History of Present Illness:   This is a 60 year old [name] male with diarrhea which has markedly improved after stopping metformin. He has a history of irritable bowel syndrome and chronic calcific pancreatitis of unknown etiology. It is not alcohol related. An upper endoscopy in September 2009 showed mild esophagitis. Small bowel biopsies were negative. He underwent a laparoscopic cholecystectomy in December 2000 because of concern that his pancreatitis may be biliary. He has had diarrhea since then. He seems to be very depressed. He and his wife report him having dizzy spells and lack of energy. He has symptomatic bradycardia treated by Dr. Ladona Ridgel. According to his last appointment, his dizziness was not related to cardiac problems. He is a diabetic and has poorly controlled blood sugars. His last colonoscopy in 2009 was a normal exam with no evidence of microscopic colitis.   GI Review of Systems    Reports abdominal pain.     Location of  Abdominal pain: left side.    Denies acid reflux, belching, bloating, chest pain, dysphagia with liquids, dysphagia with solids, heartburn, loss of appetite, nausea, vomiting, vomiting blood, weight loss, and  weight gain.      Reports diarrhea.     Denies anal fissure, black tarry stools, change in bowel habit, constipation, diverticulosis, fecal incontinence, heme positive stool, hemorrhoids, irritable bowel syndrome, jaundice, light color stool, liver problems, rectal bleeding, and  rectal pain.    Current Medications (verified): 1)  Simvastatin 40 Mg Tabs (Simvastatin) ....  Take 1 Tab At Bedtime 2)  Neurontin 600 Mg Tabs (Gabapentin) .... One By Mouth Two Times A Day 3)  Actos 45 Mg Tabs (Pioglitazone Hcl) .... Take 1 Tab Once Daily 4)  Ambien Cr 12.5 Mg Cr-Tabs (Zolpidem Tartrate) .... Take As Needed 5)  Tamsulosin Hcl 0.4 Mg Caps (Tamsulosin Hcl) .Marland Kitchen.. 1 Capsule By Mouth Once Daily 6)  Paxil 20 Mg Tabs (Paroxetine Hcl) .Marland Kitchen.. 1 Tablet By Mouth Once Daily 7)  Skelaxin 800 Mg Tabs (Metaxalone) .... 1/2 Tablet By Mouth Two Times A Day 8)  Ranitidine Hcl 150 Mg Caps (Ranitidine Hcl) .... One By Mouth Daily 9)  Lantus 100 Unit/ml Soln (Insulin Glargine) .... Take 25 Units By Mouth Every Night  Allergies (verified): No Known Drug Allergies  Past History:  Past Medical History: Current Problems:  DIABETES MELLITUS, TYPE II, ON INSULIN (ICD-250.00) DIZZINESS (ICD-780.4) NONSPEC ABN FINDNG RAD & OTH EXAM ABDOMINAL AREA (ICD-793.6) DIVERTICULOSIS-COLON (ICD-562.10) ABDOMINAL PAIN-PERIUMBILICAL (ICD-789.05) LUQ PAIN (ICD-789.02) CHOLELITHIASIS (ICD-574.20) Hx of MELANOMA (ICD-172.9) Hx of PERSONAL HISTORY OF MALIGNANT MELANOMA OF SKIN (ICD-V10.82) NAUSEA AND VOMITING (ICD-787.01) WEIGHT LOSS-ABNORMAL (ICD-783.21) DIARRHEA (ICD-787.91) ABDOMINAL PAIN-LLQ (ICD-789.04) ABDOMINAL PAIN-GENERALIZED (ICD-789.07) HEADACHE, CHRONIC (ICD-784.0) HYPERLIPIDEMIA (ICD-272.4) DM (ICD-250.00) ANAL FISSURE, HX OF (ICD-V13.3) CARDIAC PACEMAKER IN SITU (ICD-V45.01)  Past Surgical History: Anal Fissure surgery Pacemaker placed in 2003 x 2 Cholecystectomy  Family History: Reviewed history from 05/01/2009 and no changes required. Stomach wall CA-Mother Prostate-Father Diabetes-Father No FH of Colon Cancer: Family History of Heart Disease: MGM, PGF, PGM Family History of Prostate Cancer: Paternal Grandfather  Social History: Reviewed history from 04/16/2009 and no changes required. Occupation: Retired Patient has never smoked.  Daily Caffeine Use Coffee and  sodas Patient does not get regular exercise.  Alcohol Use - no no children   Review of Systems       The patient complains of arthritis/joint pain, change in vision, depression-new, headaches-new, shortness of breath, sleeping problems, thirst - excessive, urine leakage, and vision changes.  The patient denies allergy/sinus, anemia, anxiety-new, back pain, blood in urine, breast changes/lumps, confusion, cough, coughing up blood, fainting, fatigue, fever, hearing problems, heart murmur, heart rhythm changes, itching, muscle pains/cramps, night sweats, nosebleeds, skin rash, sore throat, swelling of feet/legs, swollen lymph glands, urination - excessive, urination changes/pain, and voice change.         Pertinent positive and negative review of systems were noted in the above HPI. All other ROS was otherwise negative.   Vital Signs:  Patient profile:   60 year old male Height:      68 inches Weight:      191.6 pounds BMI:     29.24 Pulse rate:   76 / minute Pulse rhythm:   regular BP sitting:   112 / 72  Vitals Entered By: Harlow Mares CMA Duncan Dull) (February 24, 2010 3:56 PM)  Physical Exam  General:  alert and oriented, appears depressed. Eyes:  nonicteric. Mouth:  normal mucosa. Neck:  Supple; no masses or thyromegaly. Lungs:  Clear throughout to auscultation. Heart:  Regular rate and rhythm; no murmurs, rubs,  or bruits. Abdomen:  soft abdomen with tenderness to the left of the umbilicus and in the lower epigastric area without pulsations or bruit. There is no fullness, there is no CVA tenderness. Liver edge at costal margin. Rectal:  normal rectal tone. The stool is Hemoccult negative. Extremities:  No clubbing, cyanosis, edema or deformities noted. Skin:  Intact without significant lesions or rashes. Psych:  Alert and cooperative. Normal mood and affect.   Impression & Recommendations:  Problem # 1:  PANCREATITIS, CHRONIC (ICD-577.1) Patient has chronic calcific pancreatitis   localized in the head of the pancreas of unknown etiology. He is status post EUS in 2009. He had negative tumor markers. We will repeat his CEA level, lipase, amylase and CA 19-9. He has upper abdominal pain which is undoubtedly due to pancreatitis. We will start him on pancreatic enzymes, Creon 12,000 units about 1-2 tablets with each meal.  Problem # 2:  DIABETES MELLITUS, TYPE II, ON INSULIN (ICD-250.00) He requested a referral to endocrinologist for management of his diabetes. Orders: Endocrinology (Endocrinology)  Problem # 3:  DIZZINESS (ICD-780.4) Patient is status post evaluation for symptomatic bradycardia by Dr. Ladona Ridgel. He is status post pacemaker implantation.  Problem # 4:  DIVERTICULOSIS-COLON (ICD-562.10) Patient is up-to-date on his colonoscopy. He had a normal exam in 2009.  Other Orders: T-CA 19-9 (21308-65784) TLB-Amylase (82150-AMYL) TLB-Lipase (83690-LIPASE) TLB-CEA (Carcinoembryonic Antigen) (82378-CEA)  Patient Instructions: 1)  We have scheduled you for an appointment with Dr Everardo All for management of your diabetes. You will be seeing him at 9:45 am on 03/12/10. 2)  Please go to the basement floor before leaving the office today to have you amylase, lipase, CEA and CA19-9 drawn. 3)  Take Align 1 capsule by mouth once daily.  4)  Samples of pancreatic enzymes (creon) have been given for you to try. 5)  Copy sent to : Feliciana Rossetti, MD 6)  The medication list was reviewed and reconciled.  All changed / newly prescribed medications were explained.  A complete medication list was provided to the patient / caregiver.

## 2010-08-12 NOTE — Letter (Signed)
Summary: [facility]   Imported By: Sherian Rein 09/18/2009 13:33:58  _____________________________________________________________________  External Attachment:    Type:   Image     Comment:   External Document

## 2010-08-12 NOTE — Progress Notes (Signed)
Summary: Dizziness  Phone Note Call from Patient Call back at Home Phone [phone]   Caller: Patient Summary of Call: Pt have having dizziness Initial call taken by: Judie Grieve,  February 03, 2010 12:00 PM  Follow-up for Phone Call        PER PT C/O DIZZINESS WHEN BENDING OVER  FOR A COUPLE OF WEEKS  COMING CLOSE TO PASSING OUT. PT DOES F/U WITH PHONE CHECKS  OVER PHONE BUT HAS NOT BEEN SEEN BY DR Ladona Ridgel SINCE 2009 APPT MADE FOR 02/10/10 AT 2:30 ALSO SPOKE WITH PAULA   IN DEVICE CLINIC WILL SEE TODAY AT 3:00 PT'S WIFE AWARE OF TODAYS APPT. Follow-up by: Scherrie Bateman, LPN,  February 03, 2010 12:47 PM

## 2010-08-12 NOTE — Letter (Signed)
Summary: [facility]   Imported By: Sherian Rein 09/04/2009 15:18:22  _____________________________________________________________________  External Attachment:    Type:   Image     Comment:   External Document

## 2010-08-14 NOTE — Letter (Signed)
Summary: Appointment - Reminder 2  [facility], Main Office  [address]   [place], [place] [address]   Phone: [phone]  Fax: [phone]     July 24, 2010 MRN: [id number]   [facility] [address] [place], [place]  [address]   Dear Mr. ROBIN,  Our records indicate that it is time to schedule a follow-up appointment.  Dr.Taylor recommended that you follow up with [place] in January. It is very important that we reach you to schedule this appointment. We look forward to participating in your health care needs. Please contact us at the number listed above at your earliest convenience to schedule your appointment.  If you are unable to make an appointment at this time, give [place] a call so we can update our records.     Sincerely,   Glass blower/designer

## 2010-09-29 ENCOUNTER — Encounter: Payer: Self-pay | Admitting: Internal Medicine

## 2010-09-29 DIAGNOSIS — I459 Conduction disorder, unspecified: Secondary | ICD-10-CM

## 2010-10-13 ENCOUNTER — Telehealth: Payer: Self-pay | Admitting: Internal Medicine

## 2010-10-13 LAB — GLUCOSE, CAPILLARY
Glucose-Capillary: 120 mg/dL — ABNORMAL HIGH (ref 70–99)
Glucose-Capillary: 148 mg/dL — ABNORMAL HIGH (ref 70–99)
Glucose-Capillary: 152 mg/dL — ABNORMAL HIGH (ref 70–99)

## 2010-10-13 LAB — CBC
HCT: 47.3 % (ref 39.0–52.0)
MCHC: 33.9 g/dL (ref 30.0–36.0)
MCV: 89.6 fL (ref 78.0–100.0)
Platelets: 164 10*3/uL (ref 150–400)
RDW: 13.1 % (ref 11.5–15.5)

## 2010-10-13 LAB — COMPREHENSIVE METABOLIC PANEL
Albumin: 3.9 g/dL (ref 3.5–5.2)
BUN: 13 mg/dL (ref 6–23)
Calcium: 9.6 mg/dL (ref 8.4–10.5)
Creatinine, Ser: 0.82 mg/dL (ref 0.4–1.5)
Total Bilirubin: 1.8 mg/dL — ABNORMAL HIGH (ref 0.3–1.2)
Total Protein: 6.8 g/dL (ref 6.0–8.3)

## 2010-10-13 NOTE — Telephone Encounter (Signed)
Faxed Cardiac Information over to [facility] @ [phone] x [phone] 10/13/10/KM

## 2010-10-15 LAB — GLUCOSE, CAPILLARY: Glucose-Capillary: 130 mg/dL — ABNORMAL HIGH (ref 70–99)

## 2010-11-25 NOTE — Assessment & Plan Note (Signed)
[facility]                         ELECTROPHYSIOLOGY OFFICE NOTE   NAME:Liscano, JORDYN DOANE                        MRN:          [id number]  DATE:09/06/2007                            DOB:          01/06/1951    Mr. Dipalma returns today for followup.  He is a very pleasant middle-aged  male with symptomatic bradycardia status post pacemaker insertion,  hypertension and dyslipidemia.  His main complaint today is that of  fatigue which has been chronic.  His wife is with him today, notes that  he has been snoring and  sometimes stops breathing at night when he  sleeps.   On exam he is a pleasant middle-aged man in no distress.  Blood pressure  108/75, pulse 70 and regular, respirations 18, weight is 210 pounds.  NECK:  Revealed no jugular venous distention.  LUNGS:  Clear bilateral to auscultation, no wheezes, rales or rhonchi  are present.  CARDIOVASCULAR:  Exam revealed a regular rate and rhythm.  Normal S1-S2.  EXTREMITIES:  Demonstrated no cyanosis, clubbing or edema.   Interrogation of his pacemaker demonstrates a [employer].  The P  and R waves were 2 and 10 respectively.  The impedance 580 in the A,  360 in the V.  Threshold 0.6 at 0.4 the A and 2 at 1 in the V. The  battery is at the beginning of life.  He was 70% A paced, 30% V paced.  AV delay was set at 300.   IMPRESSION:  1. Symptomatic bradycardia  2. Hypertension.  3. Probable sleep apnea.   DISCUSSION:  I have recommended that the patient undergo a sleep study.  He is considering  his options and will call us if he would like to do  this.  We will plan to see him back in the office in 6 months for  pacemaker check and 1 year with me.     Doylene Canning. Ladona Ridgel, MD  Electronically Signed    GWT/MedQ  DD: 09/06/2007  DT: 09/06/2007  Job #: [id number]   cc:   Feliciana Rossetti, MD

## 2010-11-28 NOTE — Op Note (Signed)
[place]  Patient:    Mark Rojas, Mark Rojas                        MRN: [id number] Proc. Date: 09/25/99 Adm. Date:  [id number] Attending:  Lewayne Bunting CC:         Feliciana Rossetti, M.D., [place], [place]             Kathrine Cords, [facility]             Delsa Grana, [facility]                           Operative Report  PROCEDURE:  Implantation of a dual-chamber pacemaker.  INDICATION:  Symptomatic sinus bradycardia.  SURGEON:  Rudene Christians. Ladona Ridgel, M.D.    I. INTRODUCTION:  The patient is a very pleasant 60 year old man referred by      Dr. Feliciana Rossetti in [place] for evaluation.  His history dates back several      weeks ago when he began feeling symptoms of weakness and fatigue as well as      dizziness and lightheadedness.  He was subsequently also found to have      substernal chest pain.  He was admitted to the hospital and underwent      monitoring on telemetry where his heart rate ranged in the mid-30s to mid-40s.      During that hospitalization, he had a Persantine Cardiolite stress test which      demonstrated no clear-cut evidence of ischemia.  He was subsequently referred      for additional evaluation.  The patient underwent a Holter monitor which      demonstrated average heart rate in the low-to-mid-50s and bradycardia down to      the mid-30s.  During exertion, his wife, who is a Designer, jewellery, routinely      would check his heart rate and found it to be in the mid-to-lower-40s. This      was associated with dizziness.  In addition, the patient continued to have      recurrent substernal chest pressure and because of his obesity and diabetes,      subsequently underwent left heart catheterization which demonstrated no      obstructive coronary disease and preserved LV systolic function.  He is      subsequently referred now for a permanent pacemaker implantation.   II. PROCEDURE:  After informed consent was obtained, the patient  was taken to he      diagnostic EP lab in a fasting state.  After the usual preparation and      draping, intravenous Fentanyl and midazolam were given for conscious sedation.      A total of 20 cc of lidocaine were infiltrated into the left deltopectoral      space.  A 7-cm incision was then carried out over the left deltopectoral      groove and electrocautery was utilized to dissect down to the subpectoralis      fascia.  A subcutaneous pocket was then fashioned with electrocautery.      Twenty cc of contrast were then injected into the left upper arm venous      system, demonstrating the patent left subclavian vein and the subclavian vein      was then punctured and peel-away sheaths were advanced into the vein.  By ay      of  these peel-away introducer sheaths, the CPI, model 4453, 58-cm bipolar,      passive-fixation pacing lead was advanced into the RV apex.  Following this,      the CPI, model 4478, 52-cm passive-fixation bipolar lead was inserted into the      right atrium.  The ventricular lead was then maneuvered out to the region f      the RV apex and at this location, the R waves were 14 mV.  The ventricular      pacing threshold was 0.6 v at 0.5 msec with a ventricular pacing impedance of      630 ohms.  With the ventricular lead in satisfactory position, attention was      then turned to the atrial lead, which was positioned in the right atrial      appendage.  In this location, the P waves measured 4 mV and the atrial pacing      threshold was 0.6 v at 0.5 msec with an atrial pacing impedance of 400 ohms.      With atrial and ventricular pacing leads in satisfactory position, the leads      were secured to the subpectoralis fascia with a silk suture.  Following this,      the sew-in sleeves were also secured to the subpectoralis fascia with a silk      suture.  The CPI Discovery II, model 1283, dual-chamber pacemaker was then      connected to the atrial and  ventricular pacing leads and inserted into the      subcutaneous pocket.  Kanamycin irrigation was then utilized to irrigate the      wound and electrocautery was utilized to create hemostasis.  Following this,      the pulse generator was secured to the subpectoralis fascia and additional      irrigation was delivered into the subcutaneous pocket.  Following this, the      wound was closed with a layer of 2-0 Vicryl, followed by a layer of      3-0 Vicryl, followed by a layer of 4-0 Vicryl.  Benzoin was painted on the      skin and Steri-Strips were applied and the pressure dressing held in place.      Patient was returned to his room in good condition.  III. COMPLICATIONS:  None.   IV. RESULTS AND CONCLUSIONS:  This demonstrates successful implantation of a      dual-chamber pacing system in a patient with symptomatic bradycardia. There      were no immediate procedural complications. DD:  09/25/99 TD:  09/25/99 Job: [id number]

## 2010-11-28 NOTE — Assessment & Plan Note (Signed)
[facility]                         ELECTROPHYSIOLOGY OFFICE NOTE   NAME:Doi, EDIEL UNANGST                        MRN:          [id number]  DATE:08/31/2006                            DOB:          [birth year]    Mr. Geffert returns to day for followup.  He is a very pleasant middle-  aged male with symptomatic bradycardia and who is status post pacemaker  insertion.  He returns today for followup.  Overall, he has been stable,  though he does complain of continued and chronic fatigue.  In spite of  this, he continued to work on a regular basis.   EXAMINATION:  GENERAL:  He is a pleasant middle-aged man in no distress.  VITAL SIGNS:  Blood pressure 128/95, pulse 76 and regular, respirations  were 18.  Weight was 211 pounds.  NECK:  Revealed no jugular venous distention.  LUNGS:  Clear bilaterally to auscultation and no wheezes, rales or  rhonchi.  CARDIOVASCULAR:  Regular rate and rhythm with normal S1 and S2.  EXTREMITIES:  Demonstrate no edema.   Interrogation of the pacemaker demonstrates a Guidant Insignia implanted  in November of 2006.  The P and R waves were 3 and 11 respectively.  The  impedence 590 in the atrium and 37 in the right ventricle, a threshold  of 0.6 and 0.5, and the atrium is 1.801 in the right ventricle.  The  battery voltage is greater than 5 volts.   IMPRESSION:  1. Symptomatic bradycardia.  2. Hypertension.  3. Status post pacemaker insertion.   DISCUSSION:  Mr. Jadon Harbaugh is fairly stable today.  His pacemaker is  working satisfactory.  I will plan to see him back in the office in one  year.  He is instructed to call, should he have additional problems.  He  will continue with trans-telephonic monitoring of his pacemaker.     Doylene Canning. Ladona Ridgel, MD  Electronically Signed    GWT/MedQ  DD: 08/31/2006  DT: 09/01/2006  Job #: [id number]   cc:   Feliciana Rossetti, MD

## 2010-11-28 NOTE — Op Note (Signed)
NAME:  Mark Rojas, Mark Rojas                 ACCOUNT NO.:  [id number]   MEDICAL RECORD NO.:  [id number]          PATIENT TYPE:  OIB   LOCATION:  NA                           FACILITY:  [facility]   PHYSICIAN:  Doylene Canning. Ladona Ridgel, M.D.  DATE OF BIRTH:  May 17, 1951   DATE OF PROCEDURE:  06/02/2005  DATE OF DISCHARGE:                                 OPERATIVE REPORT   PROCEDURE PERFORMED:  Removal of previously implanted dual-chamber pacemaker  and insertion of a new dual-chamber pacemaker with pacemaker lead testing.   INTRODUCTION:  The patient is a very pleasant middle-aged male with a  history of symptomatic bradycardia who underwent permanent pacemaker  insertion approximately six years ago. He reached elective replacement  indication is now referred for pacemaker generator change.   PROCEDURE:  After informed consent was obtained, the patient was taken to  the diagnostic EP lab in the fasting state.  After the usual preparation and  draping, intravenous fentanyl and midazolam was given for sedation.  Lidocaine 30 mL was infiltrated in the left infraclavicular region.  A 5 cm  incision was carried out over this region. Electrocautery utilized to  dissect down to the fascial plane. The pacemaker pocket was entered without  difficulty and removed with gentle traction. The leads were freed up from  the pacemaker and evaluated. The atrial threshold was checked both in the  unipolar as well as the bipolar mode with an atrial threshold in both at 0.6  volts at 0.3 milliseconds. The P-waves were to 2.5  mV and the pacing  impedance 433 ohms. The right ventricular lead was also interrogated. It had  R-waves at 15 mV and a pacing impedance of 367 pounds with a pacing  threshold 1.4 volts of 0.5 milliseconds. With these satisfactory parameters,  the pacemaker pocket was irrigated and the electrocautery utilized to assure  hemostasis. The new Guidant Insignia 1+ DR model 1297 serial number [id number]  dual-chamber  pacemaker was then connected to the atrial and ventricular  pacing leads and placed back in the subcutaneous pocket.  The pocket was  irrigated with kanamycin and the incision was then closed with layer of 2-0  Vicryl followed by layer 3-0 Vicryl followed by a layer of 4-0 Vicryl.  Benzoin was painted on the skin. Steri-Strips were applied and pressure  dressing was placed. The patient was returned to his room in satisfactory  condition.   COMPLICATIONS:  There were no immediate procedure complications.   RESULTS:  This demonstrates successful removal of a previously implanted  dual-chamber pacemaker followed by pacemaker lead interrogation, followed by  insertion of a new dual-chamber pacemaker in a patient with symptomatic  bradycardia, whose pacemaker was elective replacement indication.           ______________________________  Doylene Canning. Ladona Ridgel, M.D.     GWT/MEDQ  D:  06/02/2005  T:  06/02/2005  Job:  [id number]   cc:   Feliciana Rossetti, MD  Fax: [phone]

## 2010-11-28 NOTE — Cardiovascular Report (Signed)
[place]  Patient:    Mark Rojas, Mark Rojas                        MRN: [id number] Proc. Date: 09/25/99 Adm. Date:  [id number] Attending:  Lewayne Bunting CC:         Dr. Feliciana Rossetti, [place]                        Cardiac Catheterization  PROCEDURE PERFORMED:  Left heart catheterization with coronary angiography and eft ventriculography.  INDICATIONS:  Recurrent substernal chest pain with symptoms worrisome for coronary ischemia in a patient with risk factors for coronary disease.  I. INTRODUCTION:  The patient is a 60 year old man with marked obesity and diabetes mellitus, who was initially referred to me for a second opinion for evaluation f chest pain and symptomatic sinus bradycardia.  He was hospitalized at [facility] and underwent Persantine stress testing, which demonstrated no clear-cut evidence of coronary ischemia.  Unfortunately, he continued to have recurrent substernal chest pain both with and without exertion and was subsequently referred for a left heart catheterization, followed by permanent pacemaker implantation secondary to chronotropic incompetence.  II. PROCEDURE:  After informed consent was obtained, the patient was taken to the diagnostic catheterization laboratory in the fasted state.  After usual the preparation and draping, intravenous fentanyl and midazolam were given for conscious sedation.  The right femoral artery was punctured and a 7-French hemostatic sheath was placed in the right femoral artery.  Following this, the eft Judkins catheter was inserted percutaneously through the right femoral artery and advanced into the left main coronary artery.  Selective coronary angiography of the left main system was then carried out.  The left Judkins catheter was removed and a the right Judkins catheter was inserted into the right femoral artery and advanced into the right coronary artery.  Selective coronary  angiography of the right coronary system was then carried out.  The right coronary catheter was removed nd the pigtail catheter was inserted through the right femoral artery and advanced  retrograde across the aortic valve into the left ventricle.  Left ventriculography in the RAO projection was then carried out.  III. COMPLICATIONS:  There were no immediate procedural complications.  IV. RESULTS:  A. HEMODYNAMICS:  The aortic pressure was 128/75.  The left ventricular pressure was 131/23 with a pullback pressure of 139/88.  B. LEFT VENTRICULOGRAPHY:  Left ventriculography was performed in the RAO projection with a total of 36 cc of contrast delivered for 3 seconds.  Multiple  PVCs were seen.  The LV function was grossly normal.  Although, this could not e quantified secondary to multiple PVCs during the injection.  C. CORONARY ANGIOGRAPHY:  Coronary angiography was performed, which demonstrated a patent left main coronary artery giving rise to a LAD and left circumflex coronary system.  The entire left main, LAD, and left circumflex coronary arteries along  with their diagonal and obtuse marginal branches were all free of angiographic coronary disease.  There was some luminal irregularities in the distal LAD. The first diagonal branch was a medium size vessel, as was the second diagonal branch. There was a very small third diagonal branch as well.  The left circumflex gave off three obtuse marginal branches, which were all angiographically normal.  The right coronary artery was a dominant vessel giving off the PDA.  There was also a PLSA branch giving off two posterolateral  branches.  The right coronary system was angiographically normal.  V. CONCLUSION:  Study demonstrates quantitatively normal left ventricular systolic function with no obstructive coronary disease and minimal luminal irregularities in the LAD coronary artery and the left anterior descending coronary  artery.  The patient will be referred for permanent pacemaker implantation secondary to chronotropic incompetence. DD:  09/25/99 TD:  09/25/99 Job: [id number]

## 2010-11-28 NOTE — Op Note (Signed)
NAME:  Mark Rojas, MESA                           ACCOUNT NO.:  [id number]   MEDICAL RECORD NO.:  [id number]                   PATIENT TYPE:  OIB   LOCATION:  NA                                   FACILITY:  [facility]   PHYSICIAN:  [name]. [name], M.D.             DATE OF BIRTH:  [birth year]   DATE OF PROCEDURE:  06/27/2002  DATE OF DISCHARGE:                                 OPERATIVE REPORT   PREOPERATIVE DIAGNOSIS:  Anal fissure.   POSTOPERATIVE DIAGNOSIS:  1. Anal fissure.  2. Old healed fistula in ano times two.   OPERATION PERFORMED:  1. Examination under anesthesia.  2. Lateral internal sphincterotomy.   SURGEON:  [name]. [name], M.D.   ASSISTANT:  Sheppard Plumber. Earlene Plater, M.D.   ANESTHESIA:  General.   INDICATIONS FOR PROCEDURE:  The patient is a 60 year old male who has a four  year history of painful defecation and some hematochezia.  He had been  treated medically by his primary care physician for an anal fissure with  only intermittent and incomplete results.  He was evaluated and now presents  for elective examination under anesthesia and lateral internal  sphincterotomy.   DESCRIPTION OF PROCEDURE:  The patient was brought to the operating room.  General anesthesia was administered.  He was placed in lithotomy fashion.  His perianal was prepped and draped in sterile fashion. He had received  intravenous antibiotics.  On inspection, the perianal area was consistent  with some chronic pruritus.  There were also two healed old fistulae in ano  present.  Digital rectal exam revealed no further abnormalities but did show  posterior anal fissure.  Subsequently, the area was infiltrated with 0.5%  Marcaine with epinephrine 10 cc circumferentially around the anus.  Anoscopy  was then done which revealed evidence of some prominent crypts anteriorly  consistent with the healed old fistula in ano.  One was located anteriorly  and one was to the left side at approximately 2:30.   After this was  accomplished, it was attempted to pass a larger anoscope but this was not  possible.  Subsequently, the patient's  intersphincteric groove was palpated  digitally in the left lower portion of the area of the anus and a 15 blade  was passed into the intersphincteric groove and the blade portion was  rotated out and the external sphincterotomy was accomplished without  difficulty.  Subsequently, the anus permitted using entry of the large  anoscope.  Some small superficial tear was seen near the sphincterotomy site  and bleeding from this area was controlled with Bovie cautery.  Subsequently, no further abnormalities were revealed.  Once hemostasis was  ensured, a Gelfoam roll was placed in the anus for dressing and some sterile  gauze was applied.  The patient tolerated the procedure well without  complications and was taken to the recovery room in stable condition.  [name] [name], M.D.   BET/MEDQ  D:  06/27/2002  T:  06/27/2002  Job:  [id number]

## 2010-11-28 NOTE — Discharge Summary (Signed)
[place]  Patient:    Mark Rojas, Mark Rojas                        MRN: [id number] Adm. Date:  [id number] Disc. Date: [id number] Attending:  Lewayne Bunting Dictator:   Lavella Hammock, P.A. CC:         Rudene Christians. Ladona Ridgel, M.D. [facility]             Feliciana Rossetti, M.D., [place], [place].                  Referring Physician Discharge [place]  DATE OF BIRTH:  04/26/1951.  PROCEDURES: 1. Cardiac catheterization. 2. Coronary arteriogram. 3. Left ventriculogram. 4. Insertion of permanent DDD pacemaker.  HOSPITAL COURSE:  Mark Rojas is a 60 year old male with a history of weakness and fatigue and documented sinus bradycardia.  His heart rate had been in the mid-30s to mid-40s and he was referred to [facility] for initial evaluation. He was scheduled for a permanent pacemaker and admitted for this on September 25, 1999. He had a cardiac catheterization prior to the pacemaker to rule out coronary artery disease as a contributing factor to his bradycardia.  He had a Guidant permanent DDD pacemaker implanted on September 25, 1999 without complication.  It was in DDDR  mode and was functioning well.  On September 26, 1999, he was evaluated.  Pacemaker was found to be functioning well and had good capture.  His lungs were clear to auscultation.  He was scheduled for followup with Dr. Rudene Christians. Ladona Ridgel for a wound check in two weeks and considered stable for discharge on September 11, 1999.  DISCHARGE CONDITION:  Stable.  CONSULTANTS:  None.  COMPLICATIONS:  None.  DISCHARGE DIAGNOSES: 1. Chronotropic incompetence with sinus bradycardia documented by Holter monitor. 2. Preserved left ventricular function with nonobstructive coronary artery disease    by catheterization, this admission. 3. Borderline diabetes. 4. History of nephrolithiasis.  DISCHARGE INSTRUCTIONS: 1. His activity is to include no driving or strenuous activity until cleared by    M.D. 2. He is not to  return to work until cleared by M.D. 3. He is to stick to low-fat diet. 4. He is to call our office for bleeding, swelling or drainage at the incision    site. 5. He is to follow up with Dr. Ladona Ridgel on March 27th at 3:45 p.m. 6. He is to follow up with Dr. Shary Decamp as needed.  DISCHARGE MEDICATIONS: 1. Aspirin q.d., resume one week after procedure. 2. Tylenol p.r.n. 3. Darvocet-N p.r.n. for pain. DD:  09/26/99 TD:  09/26/99 Job: [id number]

## 2010-11-28 NOTE — Discharge Summary (Signed)
NAME:  Mark Rojas, Mark Rojas                 ACCOUNT NO.:  [id number]   MEDICAL RECORD NO.:  [id number]          PATIENT TYPE:  OIB   LOCATION:  NA                           FACILITY:  [facility]   PHYSICIAN:  Maple Mirza, P.A. DATE OF BIRTH:  21-Aug-1950   DATE OF ADMISSION:  DATE OF DISCHARGE:                                 DISCHARGE SUMMARY   Audio too short to transcribe (less than 5 seconds)      Maple Mirza, P.A.     GM/MEDQ  D:  06/02/2005  T:  06/02/2005  Job:  [id number]

## 2010-11-28 NOTE — Discharge Summary (Signed)
NAME:  Mark Rojas, Armstrong                 ACCOUNT NO.:  [id number]   MEDICAL RECORD NO.:  [id number]          PATIENT TYPE:  OIB   LOCATION:  NA                           FACILITY:  [facility]   PHYSICIAN:  Doylene Canning. Ladona Ridgel, M.D.  DATE OF BIRTH:  1951-01-06   DATE OF ADMISSION:  DATE OF DISCHARGE:  06/02/2005                                 DISCHARGE SUMMARY   PRINCIPAL DIAGNOSES:  1.  Symptomatic bradycardia/chronotropic incompetence with heart rate in the      30's to 50's on Holter monitoring in 2001.  2.  Status post implantation of dual-chamber CPI-Discovery II model [phone]      dual-chamber pacemaker on September 25, 1999.  3.  Generator change for pacemaker, elective replacement indicator with the      placement of a Guidant Insignia I Plus DR model #9604 dual-chamber      pacemaker.   SECONDARY DIAGNOSES:  1.  History of left heart catheterization on September 25, 1999, for recurrent      chest pain.  This study showed normal left ventricular function and      angiographically-normal coronaries.  2.  Diabetes.   ALLERGIES:  No known drug allergies.   PROCEDURES IN HOSPITAL:  On June 02, 2005, pacemaker generator change,  as mentioned above, by Dr. Doylene Canning. Ladona Ridgel.  There were no complications.  The patient will go home on five days of oral Keflex.   HISTORY OF PRESENT ILLNESS:  Mr. Mark Rojas is a 60 year old male.  He has  a history of symptomatic bradycardia and chronotropic incompetence.  He had  a pacemaker implanted in 2001.  He returns today because his device is at  elective replacement indicator.  The patient mentions that he has been under  a great deal of stress, secondary to changes at work.  He does note some  dyspnea with exertion, and he has some fatigue.  He admits that this may be  secondary to stress of his present job.  Interrogation of the pacemaker  demonstrates the device is just above elective replacement indicator.  The  risks and benefits and expectations of  pacemaker generator change have been  described to the patient and he will have his device changed at about the  time the device reaches elective replacement, which is some time in  November.   HOSPITAL COURSE:  The patient presents on June 02, 2005, to [facility] for an elective generator change.  This was done by Dr.  Doylene Canning. Ladona Ridgel.  A new Guidant Insignia dual-chamber device was implanted  without difficulty.  The patient discharged two hours after observation.   FOLLOWUP:  He has followup as will be described in two weeks for the Pacer  Clinic.  Once again, he is to go home on antibiotic therapy for five days.   DISCHARGE MEDICATIONS:  1.  Keflex 500 mg, one tab q.6h. for five days.  2.  Enteric-coated aspirin 81 mg daily.  3.  Skelaxin 400 mg one tab t.i.d.  4.  Zoloft 100 mg daily.  5.  Lodine  400 mg b.i.d.  6.  Zocor 40 mg daily at bedtime.  7.  Neurontin 300 mg, three tab t.i.d.  8.  Flexeril 10 mg, one to two tab as needed.  9.  Glucovance to take as before this hospital visit.  10. Ambien CR 12.5 mg nightly.  11. Multivitamin daily.  12. Alprazolam 0.5 mg as needed.   DISCHARGE LABORATORY DATA:  Prior to this admission a complete blood count  was taken.  Mancuso cells 7.2, hemoglobin 15.7, hematocrit 46.4, platelets  162.  The pro-time was 12.8, INR 1.1, PTT 23.5.  Basic metabolic panel:  Sodium 140, potassium 4.3, chloride 105, bicarbonate 25, glucose 207, BUN  14, creatinine 0.9.  There are no outstanding laboratory studies.   TIME OF DISCHARGE DICTATION:  Took 20 minutes.      Maple Mirza, P.A.    ______________________________  Doylene Canning. Ladona Ridgel, M.D.    GM/MEDQ  D:  06/02/2005  T:  06/02/2005  Job:  [id number]   cc:   Doylene Canning. Ladona Ridgel, M.D.  [address]  [address]  [place]  [place] [address]   Feliciana Rossetti, MD  Fax: [phone]

## 2011-01-02 ENCOUNTER — Encounter: Payer: Self-pay | Admitting: *Deleted

## 2011-02-23 IMAGING — CT CT EXTREM UP W/ CM*L*
3 series · 8 of 14 positions shown, 9 images · IV contrast (agent unspecified)
Comparison: None.

CLINICAL DATA: Left shoulder pain.  Evaluate for rotator cuff
tear.

CT LEFT SHOULDER WITH CONTRAST
TECHNIQUE: Multidetector CT imaging of the left shoulder was
performed according to the standard protocol following
intraarticular contrast administration. Multiplanar CT image
reconstructions were also generated.

[Series 3: shoulder/standard · axial · 0.49mm/px · z∈[+66,+136]mm · 2 of 84 slices shown]
[im 28/84  soft-tissue]
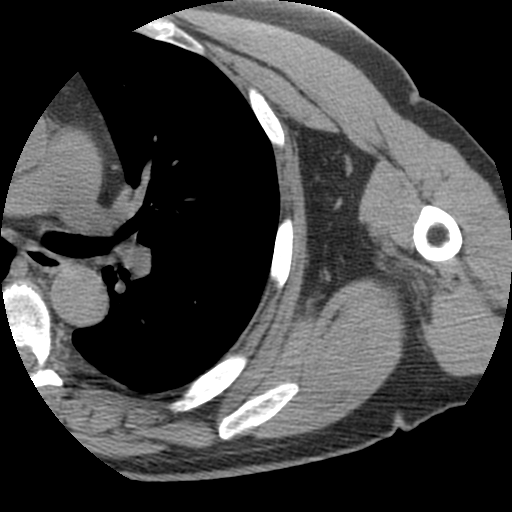
[im 56/84  soft-tissue]
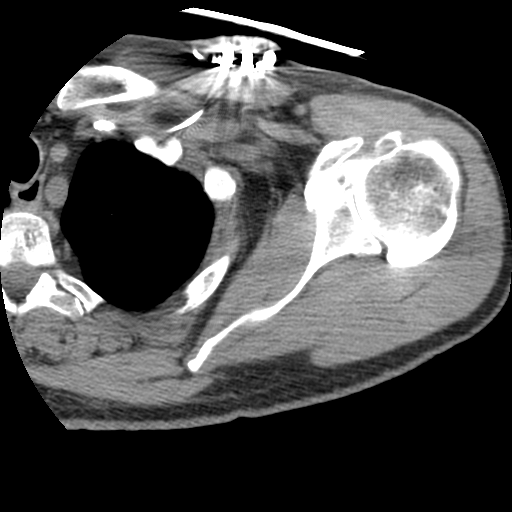

[Series 103: cor obl · coronal · 0.49mm/px · 3 of 79 slices shown, 4 images]
[im 20/79  soft-tissue]
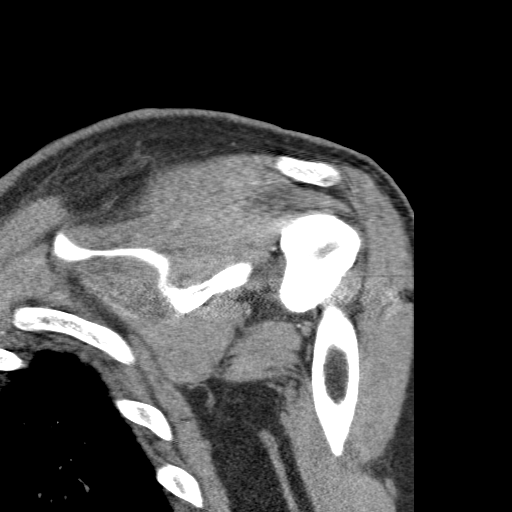
[im 20/79  bone]
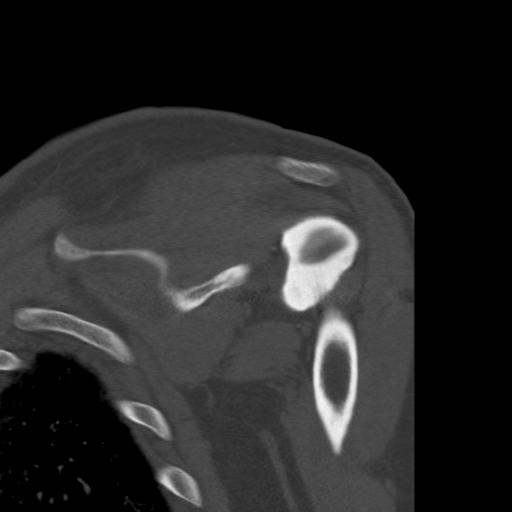
[im 40/79  bone]
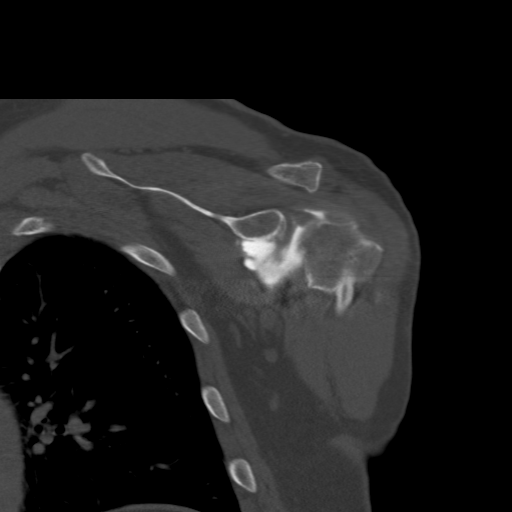
[im 59/79  bone]
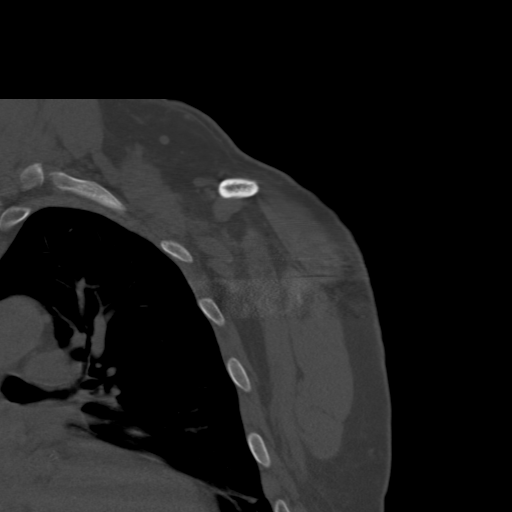

[Series 104: sag obl · sagittal · 0.49mm/px · 3 of 79 slices shown]
[im 20/79  bone]
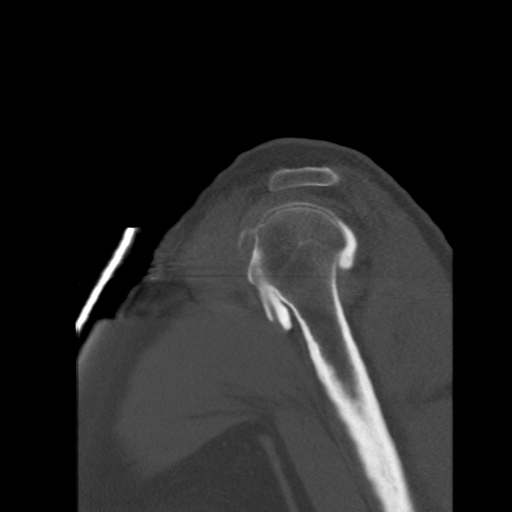
[im 40/79  bone]
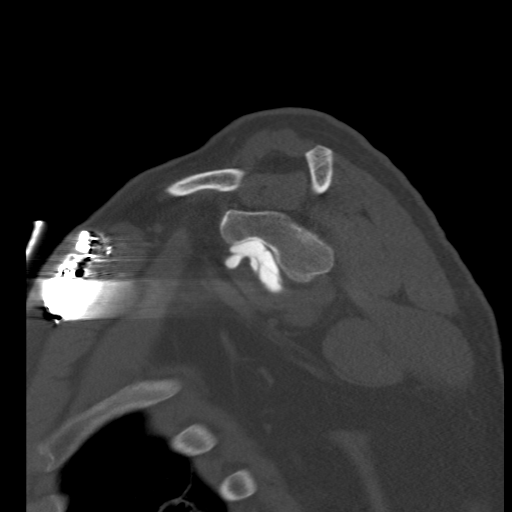
[im 59/79  bone]
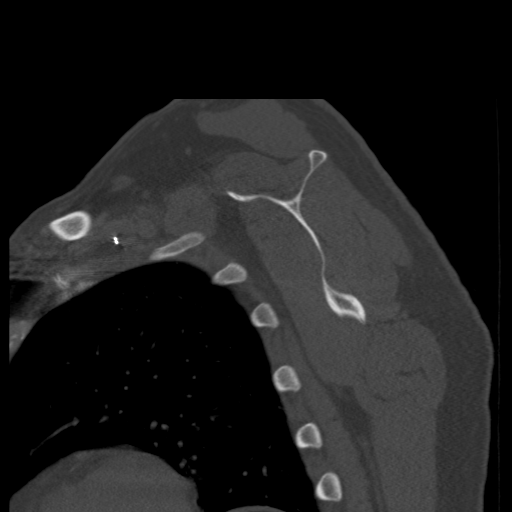

[8 of 14 positions shown; findings below may reference images not displayed]

FINDINGS: Minimal glenohumeral osteoarthritis is present.  No full-
thickness rotator cuff tear is present.  The articular surface of
the cuff appears intact.  There is no rotator cuff muscular
atrophy.  Moderate AC joint osteoarthritis is present with distal
clavicle undersurface spurring.  This has mass effect on the
adjacent supraspinatus tendon.  There is no contrast extravasation
of the subacromial/subdeltoid bursa, precluding evaluation of the
bursal surface of the rotator cuff suboptimal.

There is contrast undermining the inferior labrum at the 6 o'clock
position, best seen on coronal image number 29 consistent with a
small labral tear.  There is no paralabral cyst that opacifies with
contrast.

 Glenohumeral ligaments also appear normal.  The biceps long head
is intact.  Incidentally visualized left lung is unremarkable aside
from atelectasis and / or scarring.  Partially visualized left
subclavian dual lead cardiac pacemaker.
IMPRESSION: 1.  Small inferior labral tear at the 6 o'clock position.
2. Rotator cuff intact.
3.  Moderate AC joint osteoarthritis with distal clavicle
undersurface spurring.

## 2015-01-11 ENCOUNTER — Encounter: Payer: Self-pay | Admitting: Internal Medicine

## 2019-02-06 ENCOUNTER — Telehealth: Payer: Self-pay | Admitting: General Surgery

## 2019-02-06 NOTE — Telephone Encounter (Signed)
Covid-19 screening questions   Do you now or have you had a fever in the last 14 days? NO  Do you have any respiratory symptoms of shortness of breath or cough now or in the last 14 days? No  Do you have any family members or close contacts with diagnosed or suspected Covid-19 in the past 14 days? No  Have you been tested for Covid-19 and found to be positive? No  Patient and healthcare partner informed to wear a mask to the clinic. Patient verbalized understanding.

## 2019-02-07 ENCOUNTER — Encounter: Payer: Self-pay | Admitting: Nurse Practitioner

## 2019-02-07 ENCOUNTER — Ambulatory Visit (INDEPENDENT_AMBULATORY_CARE_PROVIDER_SITE_OTHER): Payer: Medicare Other | Admitting: Nurse Practitioner

## 2019-02-07 ENCOUNTER — Other Ambulatory Visit (INDEPENDENT_AMBULATORY_CARE_PROVIDER_SITE_OTHER): Payer: Medicare Other

## 2019-02-07 VITALS — BP 100/68 | HR 80 | Temp 98.1°F | Ht 65.75 in | Wt 196.2 lb

## 2019-02-07 DIAGNOSIS — K5730 Diverticulosis of large intestine without perforation or abscess without bleeding: Secondary | ICD-10-CM

## 2019-02-07 DIAGNOSIS — R197 Diarrhea, unspecified: Secondary | ICD-10-CM

## 2019-02-07 DIAGNOSIS — R1032 Left lower quadrant pain: Secondary | ICD-10-CM

## 2019-02-07 DIAGNOSIS — K861 Other chronic pancreatitis: Secondary | ICD-10-CM

## 2019-02-07 LAB — CBC WITH DIFFERENTIAL/PLATELET
Basophils Absolute: 0.1 10*3/uL (ref 0.0–0.1)
Basophils Relative: 0.7 % (ref 0.0–3.0)
Eosinophils Absolute: 0.2 10*3/uL (ref 0.0–0.7)
Eosinophils Relative: 2.5 % (ref 0.0–5.0)
HCT: 48.9 % (ref 39.0–52.0)
Hemoglobin: 16.2 g/dL (ref 13.0–17.0)
Lymphocytes Relative: 23.1 % (ref 12.0–46.0)
Lymphs Abs: 2.1 10*3/uL (ref 0.7–4.0)
MCHC: 33.1 g/dL (ref 30.0–36.0)
MCV: 89.8 fl (ref 78.0–100.0)
Monocytes Absolute: 0.8 10*3/uL (ref 0.1–1.0)
Monocytes Relative: 8.8 % (ref 3.0–12.0)
Neutro Abs: 5.9 10*3/uL (ref 1.4–7.7)
Neutrophils Relative %: 64.9 % (ref 43.0–77.0)
Platelets: 184 10*3/uL (ref 150.0–400.0)
RBC: 5.44 Mil/uL (ref 4.22–5.81)
RDW: 13.4 % (ref 11.5–15.5)
WBC: 9 10*3/uL (ref 4.0–10.5)

## 2019-02-07 LAB — COMPREHENSIVE METABOLIC PANEL
ALT: 35 U/L (ref 0–53)
AST: 19 U/L (ref 0–37)
Albumin: 4.5 g/dL (ref 3.5–5.2)
Alkaline Phosphatase: 126 U/L — ABNORMAL HIGH (ref 39–117)
BUN: 17 mg/dL (ref 6–23)
CO2: 27 mEq/L (ref 19–32)
Calcium: 9.5 mg/dL (ref 8.4–10.5)
Chloride: 104 mEq/L (ref 96–112)
Creatinine, Ser: 1 mg/dL (ref 0.40–1.50)
GFR: 74.13 mL/min (ref >60.00)
Glucose, Bld: 267 mg/dL — ABNORMAL HIGH (ref 70–99)
Potassium: 3.7 mEq/L (ref 3.5–5.1)
Sodium: 138 mEq/L (ref 135–145)
Total Bilirubin: 2.1 mg/dL — ABNORMAL HIGH (ref 0.2–1.2)
Total Protein: 7.4 g/dL (ref 6.0–8.3)

## 2019-02-07 LAB — LIPASE: Lipase: 6 U/L — ABNORMAL LOW (ref 11.0–59.0)

## 2019-02-07 MED ORDER — CIPROFLOXACIN HCL 500 MG PO TABS: 500.0000 mg | ORAL_TABLET | Freq: Two times a day (BID) | ORAL | 0 refills | Status: AC

## 2019-02-07 MED ORDER — METRONIDAZOLE 500 MG PO TABS: 500.0000 mg | ORAL_TABLET | Freq: Three times a day (TID) | ORAL | 0 refills | Status: AC

## 2019-02-07 NOTE — Progress Notes (Signed)
I agree with the above note, plan 

## 2019-02-07 NOTE — Patient Instructions (Signed)
We have sent the following medications to your pharmacy for you to pick up at your convenience:  Cipro 500 mg twice a day for 7 days Flagyl 500 mg three times a day for 7 days  Your provider has requested that you go to the basement level for lab work before leaving today. Press "B" on the elevator. The lab is located at the first door on the left as you exit the elevator.  We will get your colonoscopy report from the [facility] hospital.   Follow up in 3 weeks, but call if your symptoms get worse.

## 2019-02-07 NOTE — Progress Notes (Signed)
ASSESSMENT / PLAN:   48 old male with 1 month history of abdominal pain now localizing from his abdomen to LLQ.  No urinary sx. He could have mild diverticulitis.  History of calcific pancreatitis, flare of pancreatitis not excluded but seems less likely at this point. -Obtain CBC with differential, lipase, CMET -Empirically for mild sigmoid diverticulitis.  Given 7-day course of Cipro and Flagyl. -Follow-up with me in approximately 3 weeks, call in the interim if symptoms get worse -If no improvement at follow-up visit or if he gets worse in the interim then consider abdominal imaging  2. Colon cancer screening. -Request last colonoscopy report done a year ago at [facility] hospital  HPI:    Chief Complaint:   Abdominal pain   68 year old with DM 2, factor V deficiency, DVT , kidney stones , chronic calcific pancreatitis, IBS, and sigmoid diverticulosis. Followed years ago  by Dr. Olevia Perches   Approximately one month ago patient developed diffuse mid abdominal pain which lasted about 10 days. He recall some associated back pain at the time ( now resolved).  Following that the pain localized to the left mid and LLQ and his remained there since.  He has a history of kidney stones, initially thought might be passing a stone.  Says pain kind of feels like when he had pancreatitis.  The pain is constant, eating can aggravate it, especially spicy foods.  Activity also aggravates the pain however.  The pain can wake him up at night but not usually since he takes something for sleep.  No fevers.  No nausea/vomiting. He has intentionally lost 10 pounds lately.  Patient has 4-5 loose stools a day, this is unchanged.  He takes Creon.  No blood in stools.  He reports a normal colonoscopy done at the [place] in [place] approximately 1 year ago.   Data Reviewed:  No recent labs nor imaging in epic   Past Medical History:  Diagnosis Date  . Anxiety   . DM (diabetes mellitus) ([place])   . DVT (deep  venous thrombosis) ([place])   . Factor V deficiency ([place])   . Gallstones   . GERD (gastroesophageal reflux disease)   . Heart disease   . HLD (hyperlipidemia)   . Kidney stones   . Pancreatitis     Past Surgical History:  Procedure Laterality Date  . CHOLECYSTECTOMY    . DENTAL SURGERY     due to accident  . KIDNEY STONE SURGERY     multiple  . PACEMAKER IMPLANT    . PACEMAKER REVISION     x 2  . Scalp surgery     to help vision   Family History  Problem Relation Age of Onset  . Stomach cancer Mother   . Diabetes Father   . Clotting disorder Father   . Other Father        Sepsis  . Heart disease Paternal Grandfather   . Prostate cancer Paternal Grandfather    Social History   Tobacco Use  . Smoking status: Never Smoker  . Smokeless tobacco: Never Used  Substance Use Topics  . Alcohol use: Never    Frequency: Never  . Drug use: Never   Current Outpatient Medications  Medication Sig Dispense Refill  . aspirin 325 MG tablet Take 325 mg by mouth daily.    Marland Kitchen atorvastatin (LIPITOR) 20 MG tablet Take 20 mg by mouth daily.    Marland Kitchen  cyanocobalamin (,VITAMIN B-12,) 1000 MCG/ML injection Inject 1,000 mcg into the muscle every 30 (thirty) days.    Marland Kitchen esomeprazole (NEXIUM) 40 MG capsule Take 40 mg by mouth daily at 12 noon.    . gabapentin (NEURONTIN) 100 MG capsule Take 1 capsule by mouth 3 (three) times daily.    . insulin aspart (NOVOLOG) 100 UNIT/ML injection Inject 20 Units into the skin 3 (three) times daily.    . insulin glargine (LANTUS) 100 UNIT/ML injection Inject 60 Units into the skin at bedtime.    . lipase/protease/amylase (CREON) 36000 UNITS CPEP capsule Take 72,000 Units by mouth 3 (three) times daily.    . metFORMIN (GLUCOPHAGE) 500 MG tablet Take 1 tablet by mouth 2 (two) times a day.    . tamsulosin (FLOMAX) 0.4 MG CAPS capsule Take 0.8 mg by mouth at bedtime.    . traZODone (DESYREL) 150 MG tablet Take 150 mg by mouth at bedtime.    Marland Kitchen venlafaxine XR (EFFEXOR-XR)  75 MG 24 hr capsule Take 75 mg by mouth daily with breakfast.     No current facility-administered medications for this visit.    No Known Allergies   Review of Systems: Positive for arthritis, back pain, depression, fatigue, dizziness, hearing problems and sleeping problems. All other systems reviewed and negative except where noted in HPI.    Physical Exam:    Wt Readings from Last 3 Encounters:  02/07/19 196 lb 4 oz (89 kg)    BP 100/68 (BP Location: Left Arm, Patient Position: Sitting, Cuff Size: Normal)   Pulse 80   Temp 98.1 F (36.7 C)   Ht 5' 5.75" (1.67 m) Comment: height measured without shoes  Wt 196 lb 4 oz (89 kg)   BMI 31.92 kg/m  Constitutional:  Pleasant male in no acute distress. Psychiatric: Normal mood and affect. Behavior is normal. EENT: Pupils normal.  Conjunctivae are normal. No scleral icterus. Neck supple.  Cardiovascular: Normal rate, regular rhythm. No edema Pulmonary/chest: Effort normal and breath sounds normal. No wheezing, rales or rhonchi. Abdominal: Soft, nondistended, mild LLQ tenderness.  Bowel sounds active throughout. There are no masses palpable. No hepatomegaly. Neurological: Alert and oriented to person place and time. Skin: Skin is warm and dry. No rashes noted.  Tye Savoy, NP  02/07/2019, 10:32 AM

## 2019-02-14 ENCOUNTER — Other Ambulatory Visit: Payer: Self-pay

## 2019-02-14 ENCOUNTER — Telehealth: Payer: Self-pay | Admitting: Internal Medicine

## 2019-02-14 DIAGNOSIS — R7989 Other specified abnormal findings of blood chemistry: Secondary | ICD-10-CM

## 2019-02-14 NOTE — Telephone Encounter (Signed)
I spoke to the patient's wife who would like for him to see Dr Lovena Le because of dizziness, started 1 month ago, but has become worse, and occasional exertional SOB.  He is experiencing no CP or other symptoms.  Please advise, thank you

## 2019-02-14 NOTE — Telephone Encounter (Signed)
New message:    Patient wife calling concerning her husband has been having dizzy spells and would like to see the doctor soon. Please call patient.

## 2019-02-15 ENCOUNTER — Telehealth: Payer: Self-pay

## 2019-02-15 NOTE — Telephone Encounter (Signed)
See phone note

## 2019-02-15 NOTE — Telephone Encounter (Signed)
Called patient about record of his past Colonoscopy, he will find out where it was done and call us back

## 2019-02-15 NOTE — Telephone Encounter (Signed)
-----   Message from Willia Craze, NP sent at 02/13/2019  8:27 PM EDT ----- Eustaquio Maize, please let patient know that [facility] had no record of his colonoscopy. Please asks him to find out where he had it done and get a copy to [place] for our records. Thanks

## 2019-02-15 NOTE — Telephone Encounter (Signed)
Pt reported that he had colonoscopy in May 2019 at [facility].

## 2019-02-16 NOTE — Telephone Encounter (Signed)
Left message on patients voicemail to call office.   

## 2019-02-17 NOTE — Telephone Encounter (Signed)
Returned call to wife.  Pt historically followed with Dr. Lovena Le for PPM.  [facility].  Offered appt in September with Dr. Lovena Le or if they wanted to be seen sooner offered APP appt.  Because of ongoing issues Pt/wife opted to see APP.  Appt made.

## 2019-02-20 ENCOUNTER — Other Ambulatory Visit: Payer: Self-pay | Admitting: Orthopedic Surgery

## 2019-02-20 DIAGNOSIS — S83281A Other tear of lateral meniscus, current injury, right knee, initial encounter: Secondary | ICD-10-CM

## 2019-02-20 NOTE — Telephone Encounter (Signed)
Left message on patients voicemail to call office.   

## 2019-02-22 ENCOUNTER — Other Ambulatory Visit: Payer: Self-pay

## 2019-02-22 ENCOUNTER — Ambulatory Visit (INDEPENDENT_AMBULATORY_CARE_PROVIDER_SITE_OTHER): Payer: Medicare Other | Admitting: Student

## 2019-02-22 VITALS — BP 142/78 | HR 63 | Ht 68.0 in | Wt 198.0 lb

## 2019-02-22 DIAGNOSIS — R42 Dizziness and giddiness: Secondary | ICD-10-CM | POA: Diagnosis not present

## 2019-02-22 DIAGNOSIS — Z950 Presence of cardiac pacemaker: Secondary | ICD-10-CM

## 2019-02-22 LAB — CUP PACEART INCLINIC DEVICE CHECK
Brady Statistic RA Percent Paced: 66 %
Brady Statistic RV Percent Paced: 1 % — CL
Date Time Interrogation Session: 20200812040000
Implantable Lead Implant Date: 20010315
Implantable Lead Implant Date: 20010315
Implantable Lead Location: 753859
Implantable Lead Location: 753860
Implantable Lead Model: 4453
Implantable Lead Model: 4478
Implantable Lead Serial Number: 200403
Implantable Lead Serial Number: 200973
Implantable Pulse Generator Implant Date: 20150403
Lead Channel Impedance Value: 1039 Ohm
Lead Channel Impedance Value: 508 Ohm
Lead Channel Pacing Threshold Amplitude: 0.6 V
Lead Channel Pacing Threshold Amplitude: 2.1 V
Lead Channel Pacing Threshold Pulse Width: 0.4 ms
Lead Channel Pacing Threshold Pulse Width: 1 ms
Lead Channel Sensing Intrinsic Amplitude: 18.6 mV
Lead Channel Sensing Intrinsic Amplitude: 5.3 mV
Lead Channel Setting Pacing Amplitude: 2 V
Lead Channel Setting Pacing Amplitude: 4 V
Lead Channel Setting Pacing Pulse Width: 1 ms
Lead Channel Setting Sensing Sensitivity: 0.6 mV
Pulse Gen Serial Number: 405504

## 2019-02-22 NOTE — Patient Instructions (Signed)
Medication Instructions:    OTC MECLIZINE WITH PRIMARY CARE DOCTOR   If you need a refill on your cardiac medications before your next appointment, please call your pharmacy.   Lab work: NONE ORDERED  TODAY   If you have labs (blood work) drawn today and your tests are completely normal, you will receive your results only by: Marland Kitchen MyChart Message (if you have MyChart) OR . A paper copy in the mail If you have any lab test that is abnormal or we need to change your treatment, we will call you to review the results.  Testing/Procedures: NONE ORDERED  TODAY    Follow-Up: At [facility], you and your health needs are our priority.  As part of our continuing mission to provide you with exceptional heart care, we have created designated Provider Care Teams.  These Care Teams include your primary Cardiologist (physician) and Advanced Practice Providers (APPs -  Physician Assistants and Nurse Practitioners) who all work together to provide you with the care you need, when you need it. CONTACT [facility] [phone] AS NEEDED FOR  ANY CARDIAC RELATED SYMPTOMS   Any Other Special Instructions Will Be Listed Below (If Applicable).

## 2019-02-22 NOTE — Progress Notes (Signed)
Electrophysiology Office Note Date: 02/22/2019  ID:  Mark Rojas, Mark Rojas [birth year], MRN [id number]  PCP: No primary care provider on file. Primary Cardiologist: No primary care provider on file. Electrophysiologist: None  CC: Pacemaker follow-up  Mark Rojas is a 68 y.o. male with symptomatic bradycardia s/p [employer] PPM 2006, dizziness, and HLD seen today for Dr. Lovena Le. He presents today to re-establish care with our office. Last [facility], [facility]. He presents today with reports of dizziness for the past 1-2 months.    He describes these episodes as daily, and related to movement. They can occur when he is sitting, standing, or even rolling over in bed. Pt also complains of short term memory changes over the past several months. He denies exertional chest pain, palpitations, dyspnea, PND, orthopnea, nausea, vomiting, syncope, edema, weight gain, or early satiety.   He is following in [place] and does not wish to re-enroll with our clinic for remote follow up at this time.   Device History: Engineer, agricultural PPM implanted 09/25/1999, gen change 06/02/2005, gen change 10/2013 for symptomatic bradycardia.   Past Medical History:  Diagnosis Date  . Anxiety   . Crohn disease ([place])   . DM (diabetes mellitus) ([place])   . DVT (deep venous thrombosis) ([place])   . Factor V deficiency ([place])   . Gallstones   . GERD (gastroesophageal reflux disease)   . Heart disease   . HLD (hyperlipidemia)   . Kidney stones   . Pancreatitis    Past Surgical History:  Procedure Laterality Date  . CHOLECYSTECTOMY    . DENTAL SURGERY     due to accident  . KIDNEY STONE SURGERY     multiple  . PACEMAKER IMPLANT    . PACEMAKER REVISION     x 2  . Scalp surgery     to help vision    Current Outpatient Medications  Medication Sig Dispense Refill  . aspirin 325 MG tablet Take 325 mg by mouth daily.    Marland Kitchen atorvastatin (LIPITOR) 20 MG tablet Take 20 mg  by mouth daily.    . cyanocobalamin (,VITAMIN B-12,) 1000 MCG/ML injection Inject 1,000 mcg into the muscle every 30 (thirty) days.    Marland Kitchen esomeprazole (NEXIUM) 40 MG capsule Take 40 mg by mouth daily at 12 noon.    . gabapentin (NEURONTIN) 100 MG capsule Take 1 capsule by mouth 3 (three) times daily.    . insulin aspart (NOVOLOG) 100 UNIT/ML injection Inject 20 Units into the skin 3 (three) times daily.    . insulin glargine (LANTUS) 100 UNIT/ML injection Inject 60 Units into the skin at bedtime.    . lipase/protease/amylase (CREON) 36000 UNITS CPEP capsule Take 72,000 Units by mouth 3 (three) times daily.    . metFORMIN (GLUCOPHAGE) 500 MG tablet Take 1 tablet by mouth 2 (two) times a day.    . tamsulosin (FLOMAX) 0.4 MG CAPS capsule Take 0.8 mg by mouth at bedtime.    . traZODone (DESYREL) 150 MG tablet Take 150 mg by mouth at bedtime.    Marland Kitchen venlafaxine XR (EFFEXOR-XR) 75 MG 24 hr capsule Take 75 mg by mouth daily with breakfast.     No current facility-administered medications for this visit.     Allergies:   Patient has no known allergies.   Social History: Social History   Socioeconomic History  . Marital status: Married    Spouse name: Not on file  .  Number of children: 0  . Years of education: Not on file  . Highest education level: Not on file  Occupational History  . Occupation: retired  Scientific laboratory technician  . Financial resource strain: Not on file  . Food insecurity    Worry: Not on file    Inability: Not on file  . Transportation needs    Medical: Not on file    Non-medical: Not on file  Tobacco Use  . Smoking status: Never Smoker  . Smokeless tobacco: Never Used  Substance and Sexual Activity  . Alcohol use: Never    Frequency: Never  . Drug use: Never  . Sexual activity: Not on file  Lifestyle  . Physical activity    Days per week: Not on file    Minutes per session: Not on file  . Stress: Not on file  Relationships  . Social Herbalist on phone: Not  on file    Gets together: Not on file    Attends religious service: Not on file    Active member of club or organization: Not on file    Attends meetings of clubs or organizations: Not on file    Relationship status: Not on file  . Intimate partner violence    Fear of current or ex partner: Not on file    Emotionally abused: Not on file    Physically abused: Not on file    Forced sexual activity: Not on file  Other Topics Concern  . Not on file  Social History Narrative  . Not on file    Family History: Family History  Problem Relation Age of Onset  . Stomach cancer Mother   . Diabetes Father   . Clotting disorder Father   . Other Father        Sepsis  . Heart disease Paternal Grandfather   . Prostate cancer Paternal Grandfather      Review of Systems: All other systems reviewed and are otherwise negative except as noted above.  Physical Exam: Vitals:   02/22/19 1005  BP: (!) 142/78  Pulse: 63  Weight: 198 lb (89.8 kg)  Height: 5\' 8"  (1.727 m)     GEN- The patient is well appearing, alert and oriented x 3 today.   HEENT: normocephalic, atraumatic; sclera clear, conjunctiva pink; hearing intact; oropharynx clear; neck supple  Lungs- Clear to ausculation bilaterally, normal work of breathing.  No wheezes, rales, rhonchi Heart- Regular rate and rhythm, no murmurs, rubs or gallops  GI- soft, non-tender, non-distended, bowel sounds present  Extremities- no clubbing, cyanosis, or edema  MS- no significant deformity or atrophy Skin- warm and dry, no rash or lesion; PPM pocket well healed Psych- euthymic mood, full affect Neuro- strength and sensation are intact  PPM Interrogation- reviewed in detail today,  See PACEART report  EKG:  EKG is ordered today. Personal review shows A paced rhythm at 63 bpm, personally reviewed  Recent Labs: 02/07/2019: ALT 35; BUN 17; Creatinine, Ser 1.00; Hemoglobin 16.2; Platelets 184.0; Potassium 3.7; Sodium 138   Wt Readings from  Last 3 Encounters:  02/07/19 196 lb 4 oz (89 kg)     Assessment and Plan:  1.  Symptomatic bradycardia s/p [employer] PPM  Normal PPM function. RV threshold adjusted (V pacing < 1%) See Pace Art report No changes today  2. Dizziness/Vertigo No associated episodes on his device and history strongly suggestive of vertigo.  Have recommended that he try OTC meclizine and discuss with his  PCP.   3. Memory changes Per PCP  4. Follow up Pt declines follow up with [place] at this time. His device is followed in [place]. He asked about carotid dopplers, and I recommended he follow up with PCP or cardiology there at his request.  He had non-obstructive CAD by cath 11/2010.   Pt knows to call back he prefers to be followed up here by [facility].    Current medicines are reviewed at length with the patient today.   The patient does not have concerns regarding his medicines.  The following changes were made today:  none  Labs/ tests ordered today include:  Orders Placed This Encounter  Procedures  . CUP PACEART [place]  . EKG 12-Lead    Disposition:   Follow up with [facility] as needed.   Jacalyn Lefevre, PA-C  02/22/2019 9:40 AM  [facility] [address] [place] [address] [phone] (office) [phone] (fax)

## 2019-02-28 ENCOUNTER — Ambulatory Visit
Admission: RE | Admit: 2019-02-28 | Discharge: 2019-02-28 | Disposition: A | Discharge status: Home / Self Care | Payer: Medicare Other | Source: Ambulatory Visit | Attending: Orthopedic Surgery | Admitting: Orthopedic Surgery

## 2019-02-28 DIAGNOSIS — S83281A Other tear of lateral meniscus, current injury, right knee, initial encounter: Secondary | ICD-10-CM

## 2019-02-28 MED ORDER — IOPAMIDOL (ISOVUE-M 200) INJECTION 41%
40.0000 mL | Freq: Once | INTRAMUSCULAR | Status: AC
  Administered 2019-02-28: 17:00:00 30 mL via INTRA_ARTICULAR

## 2019-03-06 ENCOUNTER — Encounter: Payer: Self-pay | Admitting: Nurse Practitioner

## 2019-03-06 ENCOUNTER — Ambulatory Visit (INDEPENDENT_AMBULATORY_CARE_PROVIDER_SITE_OTHER): Payer: Medicare Other | Admitting: Nurse Practitioner

## 2019-03-06 VITALS — BP 110/72 | HR 76 | Temp 98.1°F | Ht 67.75 in | Wt 198.4 lb

## 2019-03-06 DIAGNOSIS — K5732 Diverticulitis of large intestine without perforation or abscess without bleeding: Secondary | ICD-10-CM | POA: Diagnosis not present

## 2019-03-06 MED ORDER — AMOXICILLIN-POT CLAVULANATE 875-125 MG PO TABS: 1.0000 | ORAL_TABLET | Freq: Two times a day (BID) | ORAL | 0 refills | Status: DC

## 2019-03-06 NOTE — Patient Instructions (Addendum)
If you are age 69 or older, your body mass index should be between 23-30. Your Body mass index is 30.39 kg/m. If this is out of the aforementioned range listed, please consider follow up with your Primary Care Provider.  If you are age 48 or younger, your body mass index should be between 19-25. Your Body mass index is 30.39 kg/m. If this is out of the aformentioned range listed, please consider follow up with your Primary Care Provider.   We have sent the following medications to your pharmacy for you to pick up at your convenience: Augmentin  You have been given a Low Fiber Diet - then high fiber diet after feeling better.  Follow up with me on March 28, 2019 at 9:30 am.   Thank you for choosing me and [facility].   Tye Savoy, NP

## 2019-03-06 NOTE — Progress Notes (Signed)
Chief Complaint:  Follow up LLQ pain.     IMPRESSION and PLAN:       73. 68 yo male here for follow up of acute LLQ pain for which I evaluated him on 02/07/19. Treated empirically for diverticulosis (diverticulosis documented on CT scan in 2010) with ~ 75% resolution of pain. Still with residual LLQ discomfort, especially following meals and still with mild LLQ tenderness on exam.  -Repeat a course of antibiotics. Trial of Augmentin this time. Tolerated Cipro / Flagyl but Flagyl had terrible taste -ROV in 2-3 weeks, sooner if symptoms getting worse / fever -Recommended he avoid high fiber food until feeling better -If still having pain / tenderness at time of follow up visit then consider CTAP.    2. GERD / Barrett's Esophagus ? With low grade dysplasia.   --In with the colonoscopy report from 12/08/18 recieved from the [place] as requested there was a path report from an EGD done on same day.  EGJ biopsies showed squamous and glandular mucosa with intestinal metaplasia and glandular atypia indefinite for dysplasia.  I will let Dr. Ardis Hughs review path and determine appropriate time for surveillance EGD.    HPI:     Patient is a 68 yo male with a hx of chronic calcific pancreatitis , anal fissure, DVT, Factor V deficiency, bradycardia / PPM. I  late him July 2020 for LLQ abdominal pain present for about a month. Treated empirically with diverticulitis with cipro / flagyl. Symptoms quickly improved on antibiotics. He still feels the same discomfort, especially after eating though the "hard" pain not nearly as bad after taking course of antibiotics. No urinary symptoms. No constipation /diarrhea or blood in stools. No fevers.   Data Reviewed:  Requested colonoscopy report from [place] :  11/09/18 colonoscopy for diarrhea Normal to cecum / TI. Adequate bowel prep.  Random biopsies and TI biopsies all normal.   EGJ biopsies, EGD report not recieved: Barrett's with low grade dysplasia  Review of  systems:     No chest pain, no SOB, no fevers, no urinary sx   Past Medical History:  Diagnosis Date  . Anxiety   . DM (diabetes mellitus) ([place])   . DVT (deep venous thrombosis) ([place])   . Factor V deficiency ([place])   . Gallstones   . GERD (gastroesophageal reflux disease)   . Heart disease   . HLD (hyperlipidemia)   . Kidney stones   . Pancreatitis     Patient's surgical history, family medical history, social history, medications and allergies were all reviewed in Epic   Creatinine clearance cannot be calculated (Patient's most recent lab result is older than the maximum 21 days allowed.)  Current Outpatient Medications  Medication Sig Dispense Refill  . aspirin 325 MG tablet Take 325 mg by mouth daily.    Marland Kitchen atorvastatin (LIPITOR) 20 MG tablet Take 20 mg by mouth daily.    . cyanocobalamin (,VITAMIN B-12,) 1000 MCG/ML injection Inject 1,000 mcg into the muscle every 30 (thirty) days.    Marland Kitchen esomeprazole (NEXIUM) 40 MG capsule Take 40 mg by mouth daily at 12 noon.    . gabapentin (NEURONTIN) 100 MG capsule Take 1 capsule by mouth 3 (three) times daily.    . insulin aspart (NOVOLOG) 100 UNIT/ML injection Inject 20 Units into the skin 3 (three) times daily.    . insulin glargine (LANTUS) 100 UNIT/ML injection Inject 60 Units into the skin at bedtime.    . lipase/protease/amylase (CREON)  36000 UNITS CPEP capsule Take 72,000 Units by mouth 3 (three) times daily.    . metFORMIN (GLUCOPHAGE) 500 MG tablet Take 1 tablet by mouth 2 (two) times a day.    . tamsulosin (FLOMAX) 0.4 MG CAPS capsule Take 0.8 mg by mouth at bedtime.    . traZODone (DESYREL) 150 MG tablet Take 150 mg by mouth at bedtime.    Marland Kitchen venlafaxine XR (EFFEXOR-XR) 75 MG 24 hr capsule Take 75 mg by mouth daily with breakfast.    . amoxicillin-clavulanate (AUGMENTIN) 875-125 MG tablet Take 1 tablet by mouth 2 (two) times daily. 20 tablet 0   No current facility-administered medications for this visit.     Physical Exam:      BP 110/72 (BP Location: Left Arm, Patient Position: Sitting)   Pulse 76   Temp 98.1 F (36.7 C)   Ht 5' 7.75" (1.721 m)   Wt 198 lb 6.4 oz (90 kg)   SpO2 98%   BMI 30.39 kg/m   GENERAL:  Pleasant male in NAD PSYCH: : Cooperative, normal affect EENT:  conjunctiva pink, mucous membranes moist, neck supple without masses CARDIAC:  RRR, no murmur heard, no peripheral edema PULM: Normal respiratory effort, lungs CTA bilaterally, no wheezing ABDOMEN:  Nondistended, soft, residual mild LLQ tenderness.  No obvious masses, no hepatomegaly,  normal bowel sounds SKIN:  turgor, no lesions seen Musculoskeletal:  Normal muscle tone, normal strength NEURO: Alert and oriented x 3, no focal neurologic deficits   Tye Savoy , NP 03/06/2019, 10:23 AM

## 2019-03-07 NOTE — Progress Notes (Signed)
I agree with the above note, plan.  He needs an EGD now (had LGD on GE junction biopsies).  Also please request that the EGD report be sent here as well.  I am happy to have the pathology report but the EGD note is still very helpful.    Thanks

## 2019-03-16 ENCOUNTER — Ambulatory Visit (INDEPENDENT_AMBULATORY_CARE_PROVIDER_SITE_OTHER): Payer: Medicare Other | Admitting: Endocrinology

## 2019-03-16 ENCOUNTER — Other Ambulatory Visit: Payer: Self-pay

## 2019-03-16 ENCOUNTER — Encounter: Payer: Self-pay | Admitting: Endocrinology

## 2019-03-16 VITALS — BP 120/70 | HR 60 | Ht 67.75 in | Wt 198.0 lb

## 2019-03-16 DIAGNOSIS — R7309 Other abnormal glucose: Secondary | ICD-10-CM

## 2019-03-16 DIAGNOSIS — Z794 Long term (current) use of insulin: Secondary | ICD-10-CM

## 2019-03-16 DIAGNOSIS — E119 Type 2 diabetes mellitus without complications: Secondary | ICD-10-CM

## 2019-03-16 LAB — POCT GLYCOSYLATED HEMOGLOBIN (HGB A1C): Hemoglobin A1C: 9 % — AB (ref 4.0–5.6)

## 2019-03-16 MED ORDER — INSULIN GLARGINE 100 UNIT/ML ~~LOC~~ SOLN: 40.0000 [IU] | Freq: Every day | SUBCUTANEOUS | 11 refills | Status: DC

## 2019-03-16 MED ORDER — INSULIN ASPART 100 UNIT/ML ~~LOC~~ SOLN: 35.0000 [IU] | Freq: Three times a day (TID) | SUBCUTANEOUS | 11 refills | Status: AC

## 2019-03-16 NOTE — Patient Instructions (Addendum)
good diet and exercise significantly improve the control of your diabetes.  please let me know if you wish to be referred to a dietician.  high blood sugar is very risky to your health.  you should see an eye doctor and dentist every year.  It is very important to get all recommended vaccinations.  Controlling your blood pressure and cholesterol drastically reduces the damage diabetes does to your body.  Those who smoke should quit.  Please discuss these with your doctor.  check your blood sugar twice a day.  vary the time of day when you check, between before the 3 meals, and at bedtime.  also check if you have symptoms of your blood sugar being too high or too low.  please keep a record of the readings and bring it to your next appointment here (or you can bring the meter itself).  You can write it on any piece of paper.  please call us sooner if your blood sugar goes below 70, or if you have a lot of readings over 200.   Please decrease the lantus to 40 units at bedtime, and:  Increase the novolog to 35 units 3 times a day (just before each meal).   Please come back for a follow-up appointment in 2 weeks.   We'll get you cleared as soon as we can.

## 2019-03-16 NOTE — Progress Notes (Signed)
Subjective:    Patient ID: Mark Rojas, male    DOB: August 08, 1950, 68 y.o.   MRN: [id number]  HPI pt is referred by [facility], for diabetes.  Pt states DM was dx'ed in 2002; he has mild if any neuropathy of the lower extremities; he is unaware of any associated chronic complications; he has been on insulin since 2012; pt says his diet and exercise are improved recently; he had pancreatitis in 2016.  He has never had pancreatic surgery, severe hypoglycemia or DKA.  He needs to be cleared for right TKR.   He takes novolog 20 units 3 times a day (just before each meal), and lantus, 60 units qhs. Pt says cbg varies from 56-350.  It is in general higher as the day goes on.  Pt says he never misses the insulin.   Past Medical History:  Diagnosis Date  . Anxiety   . Crohn disease ([place])   . DM (diabetes mellitus) ([place])   . DVT (deep venous thrombosis) ([place])   . Factor V deficiency ([place])   . Gallstones   . GERD (gastroesophageal reflux disease)   . Heart disease   . HLD (hyperlipidemia)   . Kidney stones   . Pancreatitis     Past Surgical History:  Procedure Laterality Date  . CHOLECYSTECTOMY    . DENTAL SURGERY     due to accident  . KIDNEY STONE SURGERY     multiple  . PACEMAKER IMPLANT    . PACEMAKER REVISION     x 2  . Scalp surgery     to help vision    Social History   Socioeconomic History  . Marital status: Married    Spouse name: Not on file  . Number of children: 0  . Years of education: Not on file  . Highest education level: Not on file  Occupational History  . Occupation: retired  Scientific laboratory technician  . Financial resource strain: Not on file  . Food insecurity    Worry: Not on file    Inability: Not on file  . Transportation needs    Medical: Not on file    Non-medical: Not on file  Tobacco Use  . Smoking status: Never Smoker  . Smokeless tobacco: Never Used  Substance and Sexual Activity  . Alcohol use: Never    Frequency: Never  . Drug use: Never  . Sexual  activity: Not on file  Lifestyle  . Physical activity    Days per week: Not on file    Minutes per session: Not on file  . Stress: Not on file  Relationships  . Social Herbalist on phone: Not on file    Gets together: Not on file    Attends religious service: Not on file    Active member of club or organization: Not on file    Attends meetings of clubs or organizations: Not on file    Relationship status: Not on file  . Intimate partner violence    Fear of current or ex partner: Not on file    Emotionally abused: Not on file    Physically abused: Not on file    Forced sexual activity: Not on file  Other Topics Concern  . Not on file  Social History Narrative  . Not on file    Current Outpatient Medications on File Prior to Visit  Medication Sig Dispense Refill  . amoxicillin-clavulanate (AUGMENTIN) 875-125 MG tablet Take 1 tablet by mouth 2 (  two) times daily. 20 tablet 0  . aspirin 325 MG tablet Take 325 mg by mouth daily.    Marland Kitchen atorvastatin (LIPITOR) 20 MG tablet Take 20 mg by mouth daily.    . cyanocobalamin (,VITAMIN B-12,) 1000 MCG/ML injection Inject 1,000 mcg into the muscle every 30 (thirty) days.    Marland Kitchen esomeprazole (NEXIUM) 40 MG capsule Take 40 mg by mouth daily at 12 noon.    . gabapentin (NEURONTIN) 100 MG capsule Take 1 capsule by mouth 3 (three) times daily.    . lipase/protease/amylase (CREON) 36000 UNITS CPEP capsule Take 72,000 Units by mouth 3 (three) times daily.    . metFORMIN (GLUCOPHAGE) 500 MG tablet Take 1 tablet by mouth 2 (two) times a day.    . tamsulosin (FLOMAX) 0.4 MG CAPS capsule Take 0.8 mg by mouth at bedtime.    . traZODone (DESYREL) 150 MG tablet Take 150 mg by mouth at bedtime.    Marland Kitchen venlafaxine XR (EFFEXOR-XR) 75 MG 24 hr capsule Take 75 mg by mouth daily with breakfast.     No current facility-administered medications on file prior to visit.     No Known Allergies  Family History  Problem Relation Age of Onset  . Stomach  cancer Mother   . Diabetes Father   . Clotting disorder Father   . Other Father        Sepsis  . Heart disease Paternal Grandfather   . Prostate cancer Paternal Grandfather     BP 120/70 (BP Location: Left Arm, Patient Position: Sitting, Cuff Size: Normal)   Pulse 60   Ht 5' 7.75" (1.721 m)   Wt 198 lb (89.8 kg)   SpO2 98%   BMI 30.33 kg/m    Review of Systems denies weight loss, blurry vision, headache, chest pain, sob, n/v, urinary frequency, muscle cramps, excessive diaphoresis, memory loss, cold intolerance, rhinorrhea, and easy bruising.  He has mild depression.       Objective:   Physical Exam VS: see vs page GEN: no distress HEAD: head: no deformity eyes: no periorbital swelling, no proptosis external nose and ears are normal mouth: no lesion seen NECK: supple, thyroid is not enlarged CHEST WALL: no deformity LUNGS: clear to auscultation CV: reg rate and rhythm, no murmur ABD: abdomen is soft, nontender.  no hepatosplenomegaly.  not distended.  no hernia MUSCULOSKELETAL: muscle bulk and strength are grossly normal.  no obvious joint swelling.  gait is slow but steady EXTEMITIES: no deformity.  no ulcer on the feet.  feet are of normal color and temp.  no edema. Both great toenails are absent.   PULSES: dorsalis pedis intact bilat.  no carotid bruit NEURO:  cn 2-12 grossly intact.   readily moves all 4's.  sensation is intact to touch on the feet SKIN:  Normal texture and temperature.  No rash or suspicious lesion is visible.   NODES:  None palpable at the neck PSYCH: alert, well-oriented.  Does not appear anxious nor depressed.  Lab Results  Component Value Date   CREATININE 1.00 02/07/2019   BUN 17 02/07/2019   NA 138 02/07/2019   K 3.7 02/07/2019   CL 104 02/07/2019   CO2 27 02/07/2019   Lab Results  Component Value Date   TSH 1.62 04/09/2008    Lab Results  Component Value Date   HGBA1C 9.0 (A) 03/16/2019   I have reviewed outside records, and  summarized: Pt was noted to have elevated a1c, and referred here.  He was  seen for right knee injury, and surgery is planned when he is cleared.        Assessment & Plan:  Insulin-requiring type 2 DM: he needs increased rx.   Knee injury, new to me.  We discussed.  For now, we'll improve glycemic control.  We'll refine med regimen later.    Patient Instructions  good diet and exercise significantly improve the control of your diabetes.  please let me know if you wish to be referred to a dietician.  high blood sugar is very risky to your health.  you should see an eye doctor and dentist every year.  It is very important to get all recommended vaccinations.  Controlling your blood pressure and cholesterol drastically reduces the damage diabetes does to your body.  Those who smoke should quit.  Please discuss these with your doctor.  check your blood sugar twice a day.  vary the time of day when you check, between before the 3 meals, and at bedtime.  also check if you have symptoms of your blood sugar being too high or too low.  please keep a record of the readings and bring it to your next appointment here (or you can bring the meter itself).  You can write it on any piece of paper.  please call us sooner if your blood sugar goes below 70, or if you have a lot of readings over 200.   Please decrease the lantus to 40 units at bedtime, and:  Increase the novolog to 35 units 3 times a day (just before each meal).   Please come back for a follow-up appointment in 2 weeks.   We'll get you cleared as soon as we can.

## 2019-03-18 DIAGNOSIS — E119 Type 2 diabetes mellitus without complications: Secondary | ICD-10-CM | POA: Insufficient documentation

## 2019-03-28 ENCOUNTER — Ambulatory Visit: Payer: Medicare Other | Admitting: Nurse Practitioner

## 2019-03-30 ENCOUNTER — Other Ambulatory Visit: Payer: Self-pay

## 2019-03-30 ENCOUNTER — Encounter: Payer: Self-pay | Admitting: Endocrinology

## 2019-03-30 ENCOUNTER — Ambulatory Visit (INDEPENDENT_AMBULATORY_CARE_PROVIDER_SITE_OTHER): Payer: Medicare Other | Admitting: Endocrinology

## 2019-03-30 VITALS — BP 124/74 | HR 62 | Ht 67.75 in | Wt 199.4 lb

## 2019-03-30 DIAGNOSIS — E119 Type 2 diabetes mellitus without complications: Secondary | ICD-10-CM

## 2019-03-30 DIAGNOSIS — Z794 Long term (current) use of insulin: Secondary | ICD-10-CM

## 2019-03-30 NOTE — Progress Notes (Signed)
Subjective:    Patient ID: Mark Rojas, male    DOB: 1951/04/18, 68 y.o.   MRN: [id number]  HPI Pt returns for f/u of diabetes mellitus: DM type: Insulin-requiring type 2 Dx'ed: 123XX123 Complications: none Therapy: insulin since 2012 DKA: never Severe hypoglycemia: never Pancreatitis: never Pancreatic imaging (2010): calcification is noted in calcification in the pancreatic head.  Other: He needs to be cleared for right TKR, at [facility]; he takes multiple daily injections.  Interval history: plan is to first glycemic control.  We'll refine med regimen later.   He takes 2 insulins as rx'ed.  no cbg record, but states cbg's vary from 72-160.  There is no trend throughout the day.  pt states he feels well in general, except for right knee pain.   Past Medical History:  Diagnosis Date   Anxiety    Crohn disease ([place])    DM (diabetes mellitus) ([place])    DVT (deep venous thrombosis) ([facility])    Factor V deficiency ([facility])    Gallstones    GERD (gastroesophageal reflux disease)    Heart disease    HLD (hyperlipidemia)    Kidney stones    Pancreatitis     Past Surgical History:  Procedure Laterality Date   CHOLECYSTECTOMY     DENTAL SURGERY     due to accident   KIDNEY STONE SURGERY     multiple   PACEMAKER IMPLANT     PACEMAKER REVISION     x 2   Scalp surgery     to help vision    Social History   Socioeconomic History   Marital status: Married    Spouse name: Not on file   Number of children: 0   Years of education: Not on file   Highest education level: Not on file  Occupational History   Occupation: retired  Scientist, product/process development strain: Not on file   Food insecurity    Worry: Not on file    Inability: Not on Lexicographer needs    Medical: Not on file    Non-medical: Not on file  Tobacco Use   Smoking status: Never Smoker   Smokeless tobacco: Never Used  Substance and Sexual Activity   Alcohol use: Never   Frequency: Never   Drug use: Never   Sexual activity: Not on file  Lifestyle   Physical activity    Days per week: Not on file    Minutes per session: Not on file   Stress: Not on file  Relationships   Social connections    Talks on phone: Not on file    Gets together: Not on file    Attends religious service: Not on file    Active member of club or organization: Not on file    Attends meetings of clubs or organizations: Not on file    Relationship status: Not on file   Intimate partner violence    Fear of current or ex partner: Not on file    Emotionally abused: Not on file    Physically abused: Not on file    Forced sexual activity: Not on file  Other Topics Concern   Not on file  Social History Narrative   Not on file    Current Outpatient Medications on File Prior to Visit  Medication Sig Dispense Refill   aspirin 325 MG tablet Take 325 mg by mouth daily.     atorvastatin (LIPITOR) 20 MG tablet Take 20 mg  by mouth daily.     cyanocobalamin (,VITAMIN B-12,) 1000 MCG/ML injection Inject 1,000 mcg into the muscle every 30 (thirty) days.     esomeprazole (NEXIUM) 40 MG capsule Take 40 mg by mouth daily at 12 noon.     gabapentin (NEURONTIN) 100 MG capsule Take 1 capsule by mouth 3 (three) times daily.     insulin aspart (NOVOLOG) 100 UNIT/ML injection Inject 35 Units into the skin 3 (three) times daily. 10 mL 11   insulin glargine (LANTUS) 100 UNIT/ML injection Inject 0.4 mLs (40 Units total) into the skin at bedtime. 10 mL 11   lipase/protease/amylase (CREON) 36000 UNITS CPEP capsule Take 72,000 Units by mouth 3 (three) times daily.     metFORMIN (GLUCOPHAGE) 500 MG tablet Take 1 tablet by mouth 2 (two) times a day.     tamsulosin (FLOMAX) 0.4 MG CAPS capsule Take 0.8 mg by mouth at bedtime.     traZODone (DESYREL) 150 MG tablet Take 150 mg by mouth at bedtime.     venlafaxine XR (EFFEXOR-XR) 75 MG 24 hr capsule Take 75 mg by mouth daily with breakfast.       No current facility-administered medications on file prior to visit.     No Known Allergies  Family History  Problem Relation Age of Onset   Stomach cancer Mother    Diabetes Father    Clotting disorder Father    Other Father        Sepsis   Heart disease Paternal Grandfather    Prostate cancer Paternal Grandfather     BP 124/74 (BP Location: Left Arm, Patient Position: Sitting, Cuff Size: Normal)    Pulse 62    Ht 5' 7.75" (1.721 m)    Wt 199 lb 6.4 oz (90.4 kg)    SpO2 94%    BMI 30.54 kg/m   Review of Systems Denies LOC.      Objective:   Physical Exam VITAL SIGNS:  See vs page GENERAL: no distress Pulses: dorsalis pedis intact bilat.   MSK: no deformity of the feet CV: no leg edema Skin:  no ulcer on the feet.  normal color and temp on the feet. Neuro: sensation is intact to touch on the feet Ext: there is bilateral onychomycosis of the toenails, and several absent toenails.      Assessment & Plan:  Insulin-requiring type 2 DM: uncertain glycemic control disorder: he needs clearance.  It is too soon for another A1c, so we'll check fructosamine.   Patient Instructions  check your blood sugar twice a day.  vary the time of day when you check, between before the 3 meals, and at bedtime.  also check if you have symptoms of your blood sugar being too high or too low.  please keep a record of the readings and bring it to your next appointment here (or you can bring the meter itself).  You can write it on any piece of paper.  please call us sooner if your blood sugar goes below 70, or if you have a lot of readings over 200.   Please continue the same insulins for now.   Blood tests are requested for you today.  We'll let you know about the results.  Based on the results, we'll get you cleared for surgery if we can.   Please come back for a follow-up appointment in 6 weeks.

## 2019-03-30 NOTE — Patient Instructions (Addendum)
check your blood sugar twice a day.  vary the time of day when you check, between before the 3 meals, and at bedtime.  also check if you have symptoms of your blood sugar being too high or too low.  please keep a record of the readings and bring it to your next appointment here (or you can bring the meter itself).  You can write it on any piece of paper.  please call us sooner if your blood sugar goes below 70, or if you have a lot of readings over 200.   Please continue the same insulins for now.   Blood tests are requested for you today.  We'll let you know about the results.  Based on the results, we'll get you cleared for surgery if we can.   Please come back for a follow-up appointment in 6 weeks.

## 2019-04-28 ENCOUNTER — Telehealth: Payer: Self-pay | Admitting: Nurse Practitioner

## 2019-04-28 NOTE — Telephone Encounter (Signed)
[place] [facility] Dept faxed request for Medical Records to [facility]. 04/28/19  KLM

## 2019-05-09 ENCOUNTER — Other Ambulatory Visit: Payer: Self-pay

## 2019-05-11 ENCOUNTER — Encounter: Payer: Self-pay | Admitting: Endocrinology

## 2019-05-11 ENCOUNTER — Other Ambulatory Visit: Payer: Self-pay

## 2019-05-11 ENCOUNTER — Ambulatory Visit (INDEPENDENT_AMBULATORY_CARE_PROVIDER_SITE_OTHER): Payer: Medicare Other | Admitting: Endocrinology

## 2019-05-11 VITALS — BP 110/64 | HR 72 | Ht 67.5 in | Wt 201.6 lb

## 2019-05-11 DIAGNOSIS — E119 Type 2 diabetes mellitus without complications: Secondary | ICD-10-CM

## 2019-05-11 DIAGNOSIS — Z794 Long term (current) use of insulin: Secondary | ICD-10-CM

## 2019-05-11 LAB — POCT GLYCOSYLATED HEMOGLOBIN (HGB A1C): Hemoglobin A1C: 6.9 % — AB (ref 4.0–5.6)

## 2019-05-11 MED ORDER — INSULIN GLARGINE 100 UNIT/ML ~~LOC~~ SOLN: 35.0000 [IU] | Freq: Every day | SUBCUTANEOUS | 11 refills | Status: AC

## 2019-05-11 MED ORDER — FREESTYLE LIBRE 14 DAY SENSOR MISC: 1.0000 | 3 refills | Status: AC

## 2019-05-11 NOTE — Progress Notes (Signed)
Subjective:    Patient ID: Mark Rojas, male    DOB: 1950/08/17, 68 y.o.   MRN: [id number]  HPI Pt returns for f/u of diabetes mellitus: DM type: Insulin-requiring type 2 Dx'ed: 123XX123 Complications: none Therapy: insulin since 2012 DKA: never Severe hypoglycemia: never Pancreatitis: never Pancreatic imaging (2010): calcification is noted in the pancreatic head.  Other: He needs to be cleared for right TKR, at [facility]; he takes multiple daily injections.  Interval history: he takes 2 insulins as rx'ed.  no cbg record, but states cbg's vary from 63-201. It is in general lowest fasting, and in the afternoon.  pt states he feels well in general, except for right knee pain.  Past Medical History:  Diagnosis Date  . Anxiety   . Crohn disease ([place])   . DM (diabetes mellitus) ([place])   . DVT (deep venous thrombosis) ([place])   . Factor V deficiency ([place])   . Gallstones   . GERD (gastroesophageal reflux disease)   . Heart disease   . HLD (hyperlipidemia)   . Kidney stones   . Pancreatitis     Past Surgical History:  Procedure Laterality Date  . CHOLECYSTECTOMY    . DENTAL SURGERY     due to accident  . KIDNEY STONE SURGERY     multiple  . PACEMAKER IMPLANT    . PACEMAKER REVISION     x 2  . Scalp surgery     to help vision    Social History   Socioeconomic History  . Marital status: Married    Spouse name: Not on file  . Number of children: 0  . Years of education: Not on file  . Highest education level: Not on file  Occupational History  . Occupation: retired  Scientific laboratory technician  . Financial resource strain: Not on file  . Food insecurity    Worry: Not on file    Inability: Not on file  . Transportation needs    Medical: Not on file    Non-medical: Not on file  Tobacco Use  . Smoking status: Never Smoker  . Smokeless tobacco: Never Used  Substance and Sexual Activity  . Alcohol use: Never    Frequency: Never  . Drug use: Never  . Sexual activity: Not on file   Lifestyle  . Physical activity    Days per week: Not on file    Minutes per session: Not on file  . Stress: Not on file  Relationships  . Social Herbalist on phone: Not on file    Gets together: Not on file    Attends religious service: Not on file    Active member of club or organization: Not on file    Attends meetings of clubs or organizations: Not on file    Relationship status: Not on file  . Intimate partner violence    Fear of current or ex partner: Not on file    Emotionally abused: Not on file    Physically abused: Not on file    Forced sexual activity: Not on file  Other Topics Concern  . Not on file  Social History Narrative  . Not on file    Current Outpatient Medications on File Prior to Visit  Medication Sig Dispense Refill  . aspirin 325 MG tablet Take 325 mg by mouth daily.    Marland Kitchen atorvastatin (LIPITOR) 20 MG tablet Take 20 mg by mouth daily.    . cyanocobalamin (,VITAMIN B-12,) 1000 MCG/ML injection  Inject 1,000 mcg into the muscle every 30 (thirty) days.    Marland Kitchen esomeprazole (NEXIUM) 40 MG capsule Take 40 mg by mouth daily at 12 noon.    . gabapentin (NEURONTIN) 100 MG capsule Take 1 capsule by mouth 3 (three) times daily.    . insulin aspart (NOVOLOG) 100 UNIT/ML injection Inject 35 Units into the skin 3 (three) times daily. 10 mL 11  . lipase/protease/amylase (CREON) 36000 UNITS CPEP capsule Take 72,000 Units by mouth 3 (three) times daily.    . metFORMIN (GLUCOPHAGE) 500 MG tablet Take 1 tablet by mouth 2 (two) times a day.    . tamsulosin (FLOMAX) 0.4 MG CAPS capsule Take 0.8 mg by mouth at bedtime.    . traZODone (DESYREL) 150 MG tablet Take 150 mg by mouth at bedtime.    Marland Kitchen venlafaxine XR (EFFEXOR-XR) 75 MG 24 hr capsule Take 75 mg by mouth daily with breakfast.     No current facility-administered medications on file prior to visit.     No Known Allergies  Family History  Problem Relation Age of Onset  . Stomach cancer Mother   . Diabetes  Father   . Clotting disorder Father   . Other Father        Sepsis  . Heart disease Paternal Grandfather   . Prostate cancer Paternal Grandfather     BP 110/64 (BP Location: Right Arm, Patient Position: Sitting, Cuff Size: Normal)   Pulse 72   Ht 5' 7.5" (1.715 m)   Wt 201 lb 9.6 oz (91.4 kg)   SpO2 91%   BMI 31.11 kg/m    Review of Systems He denies hypoglycemia.     Objective:   Physical Exam VITAL SIGNS:  See vs page GENERAL: no distress Pulses: dorsalis pedis intact bilat.   MSK: no deformity of the feet CV: no leg edema Skin:  no ulcer on the feet.  normal color and temp on the feet. Neuro: sensation is intact to touch on the feet Ext: multiple toenails are absent  Lab Results  Component Value Date   HGBA1C 6.9 (A) 05/11/2019       Assessment & Plan:  Insulin-requiring type 2 DM: overcontrolled.  Hypoglycemia: this limits aggressiveness of glycemic control.  knee pain: His surgical risk is low and outweighed by the potential benefit of the surgery.  She is therefore medically cleared.   Patient Instructions  check your blood sugar twice a day.  vary the time of day when you check, between before the 3 meals, and at bedtime.  also check if you have symptoms of your blood sugar being too high or too low.  please keep a record of the readings and bring it to your next appointment here (or you can bring the meter itself).  You can write it on any piece of paper.  please call us sooner if your blood sugar goes below 70, or if you have a lot of readings over 200.   Please reduce the Lantus to 35 units at bedtime, and: Please continue the same Novolog.   Please come back for a follow-up appointment in 2-3 months.

## 2019-05-11 NOTE — Patient Instructions (Addendum)
check your blood sugar twice a day.  vary the time of day when you check, between before the 3 meals, and at bedtime.  also check if you have symptoms of your blood sugar being too high or too low.  please keep a record of the readings and bring it to your next appointment here (or you can bring the meter itself).  You can write it on any piece of paper.  please call us sooner if your blood sugar goes below 70, or if you have a lot of readings over 200.   Please reduce the Lantus to 35 units at bedtime, and: Please continue the same Novolog.   Please come back for a follow-up appointment in 2-3 months.

## 2019-07-14 HISTORY — PX: KNEE SURGERY: SHX244

## 2019-07-18 ENCOUNTER — Telehealth: Payer: Self-pay

## 2019-07-18 NOTE — Telephone Encounter (Signed)
-----   Message from Willia Craze, NP sent at 07/17/2019  1:56 PM EST ----- Eustaquio Maize, I have been waiting on an EGD from [place] on this patient. Got path but not report. He needs an EGD for evaluation of esophagus. Esophageal [facility] were [facility].  We don't need to wait any longer on report. Please get him scheduled for EGD at [facility] with Dr. Ardis Hughs .  Thanks

## 2019-07-19 NOTE — Telephone Encounter (Signed)
Spoke with Mark Rojas. The patient "just got home from his knee surgery." Explained the purpose of the call. She states her husband is on anticoagulation to prevent blood clots post operatively. He will have a follow up appointment with Dr Noemi Chapel on 07/27/19. PT will begin after that. She is not certain for how long the anticoagulation is planned for Mark Rojas. However she would like to go forward with scheduling him with the understanding this may have to be changed.  Pre-visit 08/08/19 COVID 08/15/19 EGD 08/18/19 She agrees to keep [place] informed.

## 2019-08-08 ENCOUNTER — Other Ambulatory Visit: Payer: Self-pay

## 2019-08-08 ENCOUNTER — Ambulatory Visit (AMBULATORY_SURGERY_CENTER): Payer: Self-pay | Admitting: *Deleted

## 2019-08-08 VITALS — Temp 97.4°F | Ht 67.5 in | Wt 196.6 lb

## 2019-08-08 DIAGNOSIS — K229 Disease of esophagus, unspecified: Secondary | ICD-10-CM

## 2019-08-08 DIAGNOSIS — Z01818 Encounter for other preprocedural examination: Secondary | ICD-10-CM

## 2019-08-08 NOTE — Progress Notes (Signed)
Stopped Lovenox on 08-05-19  Pt is aware that care partner will wait in the car during procedure; if they feel like they will be too hot or cold to wait in the car; they may wait in the 4 th floor lobby. Patient is aware to bring only one care partner. We want them to wear a mask (we do not have any that we can provide them), practice social distancing, and we will check their temperatures when they get here.  I did remind the patient that their care partner needs to stay in the parking lot the entire time and have a cell phone available, we will call them when the pt is ready for discharge. Patient will wear mask into building.  covid test 08-15-19 at 1:00 p.m.  No egg or soy allergy  No home oxygen use or problems with anesthesia  No medications for weight loss taken  emmi information given

## 2019-08-15 ENCOUNTER — Other Ambulatory Visit: Payer: Self-pay | Admitting: Gastroenterology

## 2019-08-15 ENCOUNTER — Ambulatory Visit (INDEPENDENT_AMBULATORY_CARE_PROVIDER_SITE_OTHER): Payer: Medicare Other

## 2019-08-15 DIAGNOSIS — Z1159 Encounter for screening for other viral diseases: Secondary | ICD-10-CM

## 2019-08-16 LAB — SARS CORONAVIRUS 2 (TAT 6-24 HRS): SARS Coronavirus 2: NEGATIVE

## 2019-08-18 ENCOUNTER — Encounter: Payer: Self-pay | Admitting: Gastroenterology

## 2019-08-18 ENCOUNTER — Ambulatory Visit (AMBULATORY_SURGERY_CENTER): Payer: Medicare Other | Admitting: Gastroenterology

## 2019-08-18 ENCOUNTER — Other Ambulatory Visit: Payer: Self-pay

## 2019-08-18 VITALS — BP 116/65 | HR 60 | Temp 96.6°F | Resp 12 | Ht 67.5 in | Wt 196.6 lb

## 2019-08-18 DIAGNOSIS — K229 Disease of esophagus, unspecified: Secondary | ICD-10-CM | POA: Diagnosis not present

## 2019-08-18 DIAGNOSIS — K2970 Gastritis, unspecified, without bleeding: Secondary | ICD-10-CM | POA: Diagnosis not present

## 2019-08-18 DIAGNOSIS — K2950 Unspecified chronic gastritis without bleeding: Secondary | ICD-10-CM

## 2019-08-18 DIAGNOSIS — K228 Other specified diseases of esophagus: Secondary | ICD-10-CM

## 2019-08-18 MED ORDER — SODIUM CHLORIDE 0.9 % IV SOLN: 500.0000 mL | Freq: Once | INTRAVENOUS | Status: DC

## 2019-08-18 NOTE — Progress Notes (Signed)
Temp by LC vitals by DT   Pt's states no medical or surgical changes since previsit or office visit.  

## 2019-08-18 NOTE — Progress Notes (Signed)
Called to room to assist during endoscopic procedure.  Patient ID and intended procedure confirmed with present staff. Received instructions for my participation in the procedure from the performing physician.  

## 2019-08-18 NOTE — Progress Notes (Signed)
To [facility], VSS. Report to [email or web address]

## 2019-08-18 NOTE — Op Note (Signed)
[facility] Patient Name: Mark Rojas Procedure Date: 08/18/2019 11:57 AM MRN: [id number] Endoscopist: Milus Banister , MD Age: 69 Referring MD:  Date of Birth: 03-08-1951 Gender: Male Account #: [id number] Procedure:                Upper GI endoscopy Indications:              Heartburn, intermittent, mild; EGE at [facility] 10/2017                            pathology of GE junction reported as "indeterminant                            for dysplasia" and also as "low grade dysplasia".                            We were unable to obtain the EGD procedure report                            from the [place] however. Medicines:                Monitored Anesthesia Care Procedure:                Pre-Anesthesia Assessment:                           - Prior to the procedure, a History and Physical                            was performed, and patient medications and                            allergies were reviewed. The patient's tolerance of                            previous anesthesia was also reviewed. The risks                            and benefits of the procedure and the sedation                            options and risks were discussed with the patient.                            All questions were answered, and informed consent                            was obtained. Prior Anticoagulants: The patient has                            taken no previous anticoagulant or antiplatelet                            agents. ASA Grade Assessment: II - A patient with  mild systemic disease. After reviewing the risks                            and benefits, the patient was deemed in                            satisfactory condition to undergo the procedure.                           After obtaining informed consent, the endoscope was                            passed under direct vision. Throughout the                            procedure, the patient's blood  pressure, pulse, and                            oxygen saturations were monitored continuously. The                            Endoscope was introduced through the mouth, and                            advanced to the second part of duodenum. The upper                            GI endoscopy was accomplished without difficulty.                            The patient tolerated the procedure well. Scope In: Scope Out: Findings:                 Slightly irregular, non-nodular Z line. Biopsies                            taken and sent to pathology.                           Moderate inflammation characterized by erosions,                            erythema and granularity was found in the gastric                            antrum. Biopsies were taken with a cold forceps for                            histology.                           The exam was otherwise without abnormality. Complications:            No immediate complications. Estimated blood loss:  None. Estimated Blood Loss:     Estimated blood loss: none. Impression:               - Moderate gastritis, biopsied.                           - Slightly irregular, non-nodular Z line. Biopsies                            taken and sent to pathology.                           - The examination was otherwise normal. Recommendation:           - Patient has a contact number available for                            emergencies. The signs and symptoms of potential                            delayed complications were discussed with the                            patient. Return to normal activities tomorrow.                            Written discharge instructions were provided to the                            patient.                           - Resume previous diet.                           - Continue present medications.                           - Await pathology results. Milus Banister, MD 08/18/2019 12:11:55  PM This report has been signed electronically.

## 2019-08-18 NOTE — Patient Instructions (Signed)
Read all of the handouts given to you by your recovery room nurse.  Thank-you for choosing [place] for your healthcare needs today.  YOU HAD AN ENDOSCOPIC PROCEDURE TODAY AT [facility]:   Refer to the procedure report that was given to you for any specific questions about what was found during the examination.  If the procedure report does not answer your questions, please call your gastroenterologist to clarify.  If you requested that your care partner not be given the details of your procedure findings, then the procedure report has been included in a sealed envelope for you to review at your convenience later.  YOU SHOULD EXPECT: Some feelings of bloating in the abdomen. Passage of more gas than usual.  Walking can help get rid of the air that was put into your GI tract during the procedure and reduce the bloating.  Please Note:  You might notice some irritation and congestion in your nose or some drainage.  This is from the oxygen used during your procedure.  There is no need for concern and it should clear up in a day or so.  SYMPTOMS TO REPORT IMMEDIATELY:    Following upper endoscopy (EGD)  Vomiting of blood or coffee ground material  New chest pain or pain under the shoulder blades  Painful or persistently difficult swallowing  New shortness of breath  Fever of 100F or higher  Black, tarry-looking stools  For urgent or emergent issues, a gastroenterologist can be reached at any hour by calling [phone].   DIET:  We do recommend a small meal at first, but then you may proceed to your regular diet.  Drink plenty of fluids but you should avoid alcoholic beverages for 24 hours.  ACTIVITY:  You should plan to take it easy for the rest of today and you should NOT DRIVE or use heavy machinery until tomorrow (because of the sedation medicines used during the test).    FOLLOW UP: Our staff will call the number listed on your records 48-72 hours following your procedure  to check on you and address any questions or concerns that you may have regarding the information given to you following your procedure. If we do not reach you, we will leave a message.  We will attempt to reach you two times.  During this call, we will ask if you have developed any symptoms of COVID 19. If you develop any symptoms (ie: fever, flu-like symptoms, shortness of breath, cough etc.) before then, please call [phone].  If you test positive for Covid 19 in the 2 weeks post procedure, please call and report this information to [place].    If any biopsies were taken you will be contacted by phone or by letter within the next 1-3 weeks.  Please call us at [phone] if you have not heard about the biopsies in 3 weeks.    SIGNATURES/CONFIDENTIALITY: You and/or your care partner have signed paperwork which will be entered into your electronic medical record.  These signatures attest to the fact that that the information above on your After Visit Summary has been reviewed and is understood.  Full responsibility of the confidentiality of this discharge information lies with you and/or your care-partner.

## 2019-08-22 ENCOUNTER — Telehealth: Payer: Self-pay | Admitting: *Deleted

## 2019-08-22 NOTE — Telephone Encounter (Signed)
1. Have you developed a fever since your procedure? no  2.   Have you had an respiratory symptoms (SOB or cough) since your procedure? no  3.   Have you tested positive for COVID 19 since your procedure no  4.   Have you had any family members/close contacts diagnosed with the COVID 19 since your procedure?  no   If yes to any of these questions please route to Joylene John, RN and Alphonsa Gin, Therapist, sports.  Follow up Call-  Call back number 08/18/2019  Post procedure Call Back phone  # [phone]  Permission to leave phone message Yes  Some recent data might be hidden     Patient questions:  Do you have a fever, pain , or abdominal swelling? No. Pain Score  0 *  Have you tolerated food without any problems? Yes.    Have you been able to return to your normal activities? Yes.    Do you have any questions about your discharge instructions: Diet   No. Medications  No. Follow up visit  No.  Do you have questions or concerns about your Care? No.  Actions: * If pain score is 4 or above: No action needed, pain <4.

## 2019-08-22 NOTE — Telephone Encounter (Signed)
Attempted f/u phone call. No answer. Left message. °

## 2019-08-23 ENCOUNTER — Encounter: Payer: Self-pay | Admitting: Gastroenterology

## 2020-06-29 IMAGING — XA FLUORO GUIDED NEEDLE PLACEMENT AND/OR ASPIRATION
1 series · 1 of 1 positions shown · non-contrast
Comparison: none

CLINICAL DATA: Right knee pain.  Concern for lateral meniscus tear.

[Series 1: ortho standard · 1 of 1 slices shown]
[im 1/1]
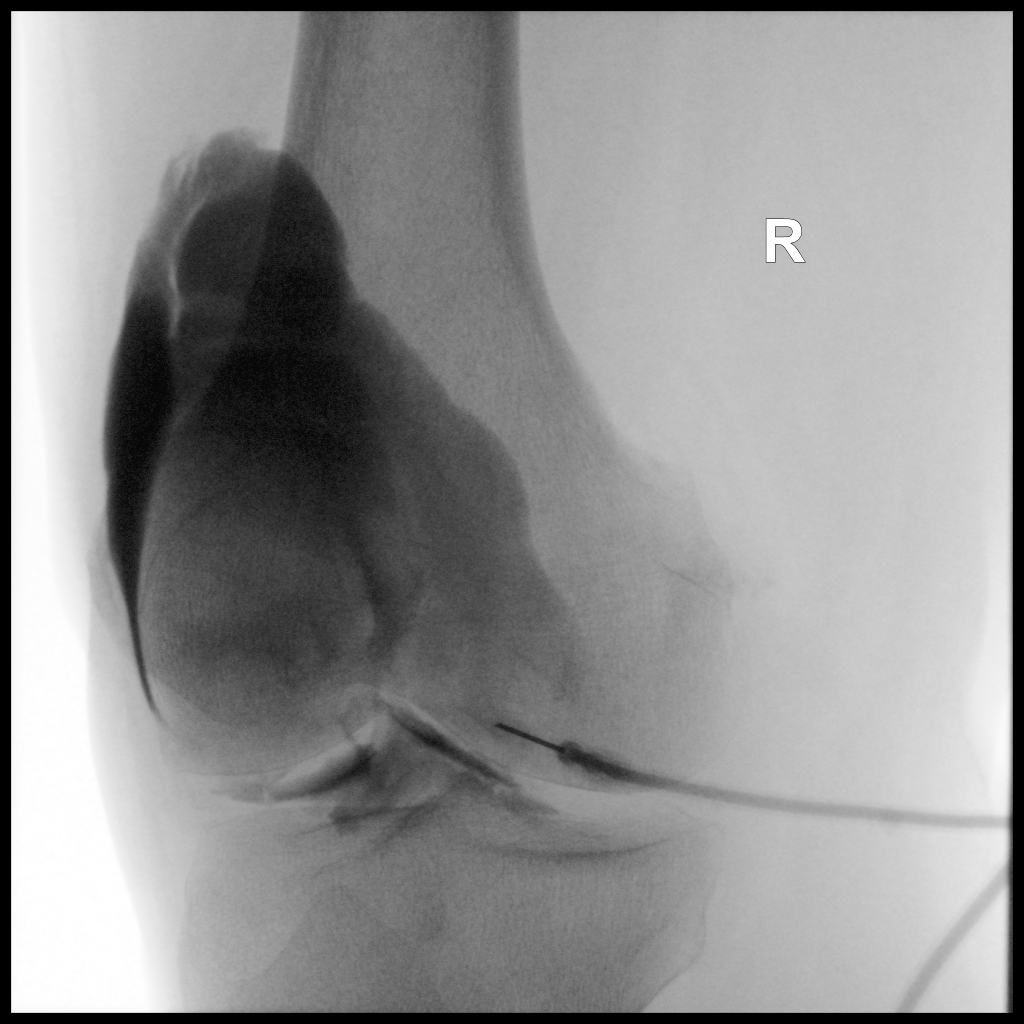

[1 of 1 positions shown; findings below may reference images not displayed]

FLUOROSCOPY TIME:  Radiation Exposure Index (as provided by the
fluoroscopic device): 0.4 mGy

Fluoroscopy Time:  Less than 1 second

Number of Acquired Images:  0

PROCEDURE:
The risks and benefits of the procedure were discussed with the
patient, and written informed consent was obtained. The patient
stated no history of allergy to contrast media. A formal timeout
procedure was performed with the patient according to departmental
protocol.

The patient was placed supine on the fluoroscopy table and the right
knee joint was identified under fluoroscopy. The skin overlying the
right knee joint was subsequently cleaned with Betadine and a
sterile drape was placed over the area of interest. 2 ml 1%
Lidocaine was used to anesthetize the skin around the needle
insertion site.

A 21 gauge spinal needle was inserted into the right knee joint
under fluoroscopy.

30 ml of contrast mixture (10 ml of sterile saline and 30 ml of
Isovue-M 200 contrast) were injected into the right knee joint.

The needle was removed and hemostasis was achieved. The patient was
subsequently transferred to CT for imaging.
IMPRESSION: Technically successful right knee injection for CT.
